# Patient Record
Sex: Female | Born: 1935 | Race: White | Hispanic: No | Smoking: Never smoker
Health system: Southern US, Community
[De-identification: ages and names within clinical notes are randomized; demographics above are authoritative.]

## PROBLEM LIST (undated history)

## (undated) DIAGNOSIS — M810 Age-related osteoporosis without current pathological fracture: Secondary | ICD-10-CM

## (undated) DIAGNOSIS — S4991XA Unspecified injury of right shoulder and upper arm, initial encounter: Secondary | ICD-10-CM

## (undated) DIAGNOSIS — M5430 Sciatica, unspecified side: Secondary | ICD-10-CM

## (undated) DIAGNOSIS — I2510 Atherosclerotic heart disease of native coronary artery without angina pectoris: Secondary | ICD-10-CM

## (undated) DIAGNOSIS — I10 Essential (primary) hypertension: Secondary | ICD-10-CM

## (undated) HISTORY — DX: Unspecified injury of right shoulder and upper arm, initial encounter: S49.91XA

## (undated) HISTORY — DX: Age-related osteoporosis without current pathological fracture: M81.0

## (undated) HISTORY — PX: CATARACT EXTRACTION: SUR2

## (undated) HISTORY — PX: OTHER SURGICAL HISTORY: SHX169

## (undated) HISTORY — DX: Essential (primary) hypertension: I10

## (undated) HISTORY — PX: STENT PLACEMENT VASCULAR (ARMC HX): HXRAD1737

## (undated) HISTORY — DX: Atherosclerotic heart disease of native coronary artery without angina pectoris: I25.10

## (undated) HISTORY — DX: Sciatica, unspecified side: M54.30

---

## 2016-02-17 ENCOUNTER — Other Ambulatory Visit: Payer: Self-pay | Admitting: Urology

## 2016-02-17 DIAGNOSIS — R3129 Other microscopic hematuria: Secondary | ICD-10-CM

## 2016-02-24 ENCOUNTER — Ambulatory Visit
Admission: RE | Admit: 2016-02-24 | Discharge: 2016-02-24 | Disposition: A | Discharge status: Home / Self Care | Payer: Medicare Other | Source: Ambulatory Visit | Attending: Urology | Admitting: Urology

## 2016-02-24 DIAGNOSIS — R3129 Other microscopic hematuria: Secondary | ICD-10-CM | POA: Insufficient documentation

## 2017-10-26 IMAGING — US US RENAL
1 series · 14 of 25 positions shown · non-contrast
Comparison: None in PACs

CLINICAL DATA: Microscopic hematuria

EXAM:
RENAL / URINARY TRACT ULTRASOUND COMPLETE

[Series 1: us renal · 0.22mm/px · 14 of 63 slices shown]
[im 1/63]
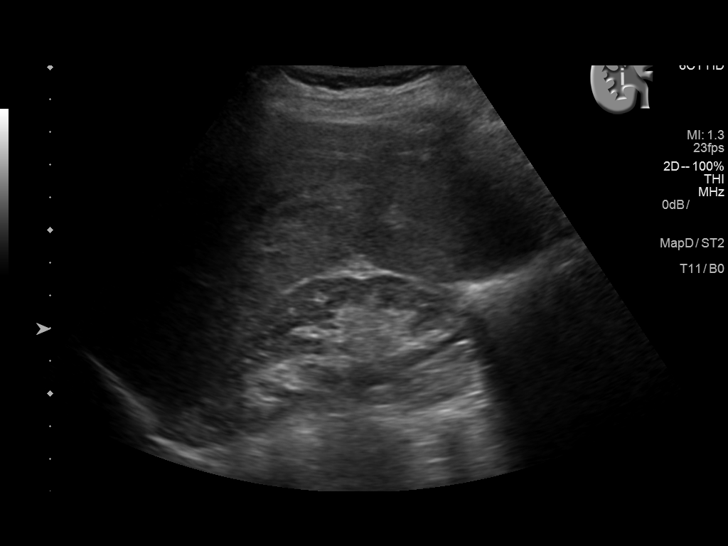
[im 6/63]
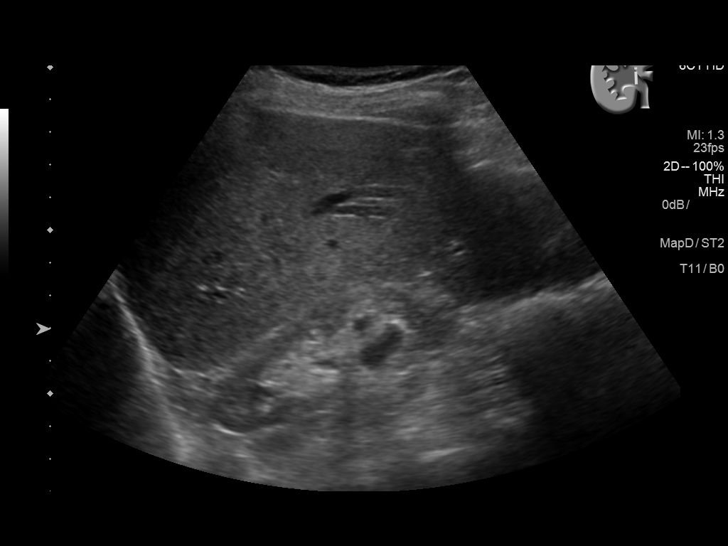
[im 11/63]
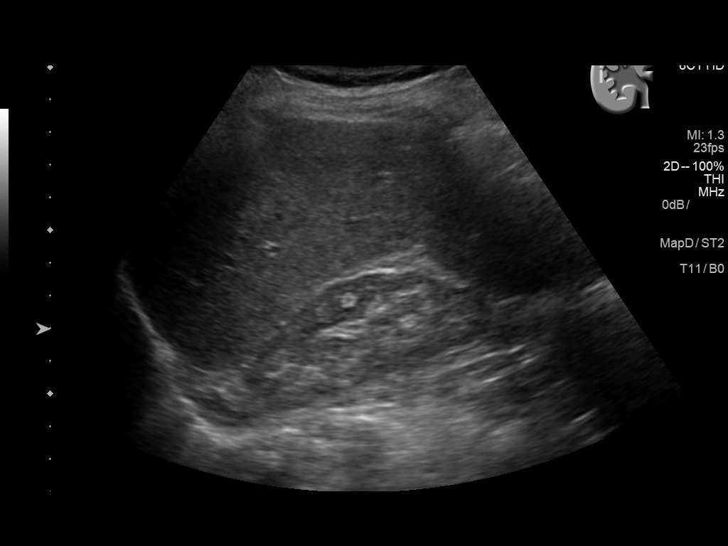
[im 16/63]
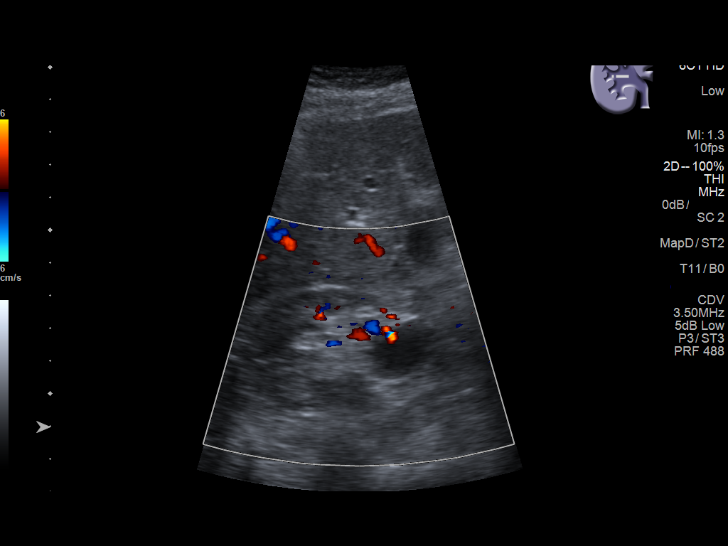
[im 21/63]
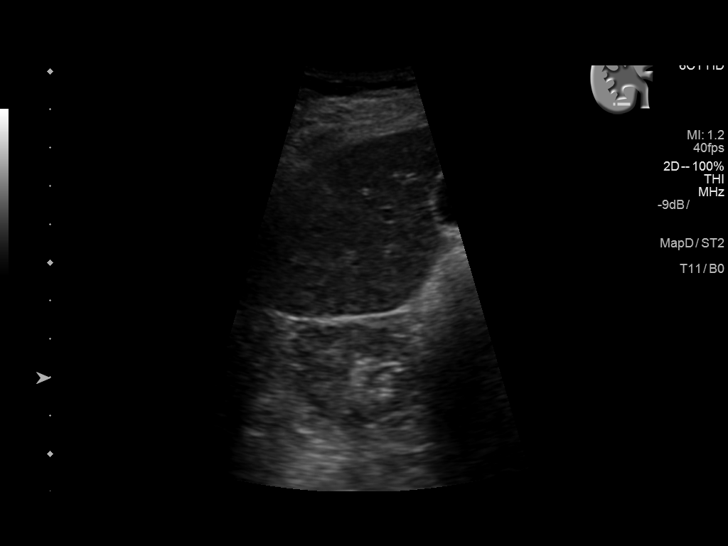
[im 24/63]
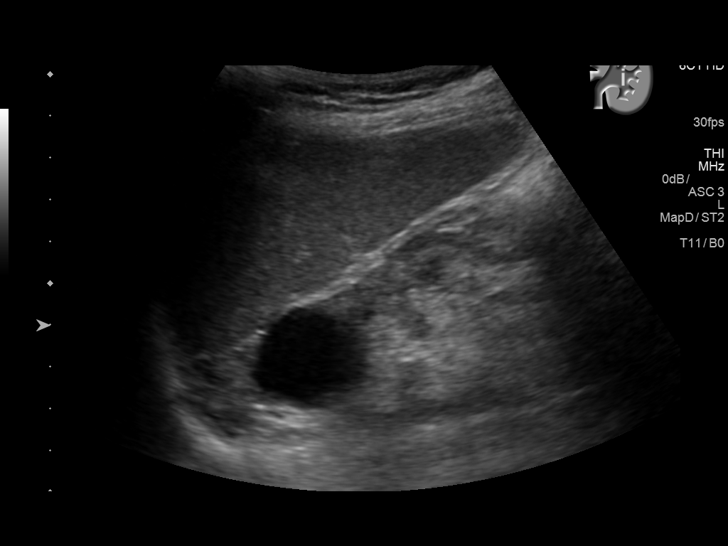
[im 29/63]
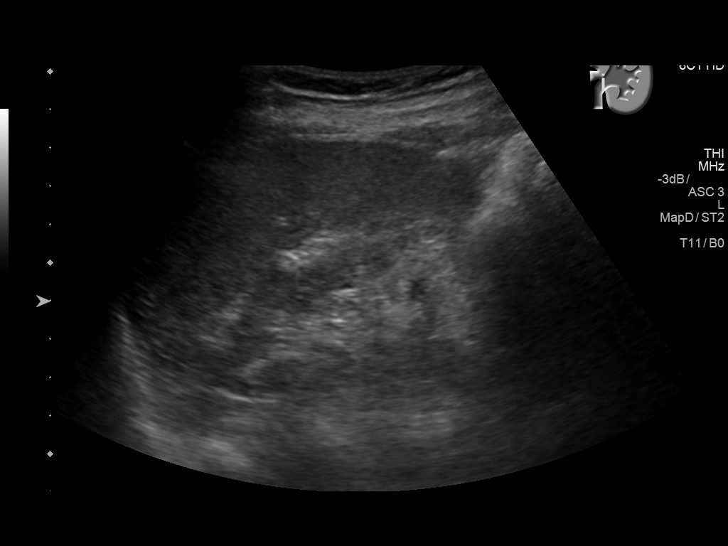
[im 34/63]
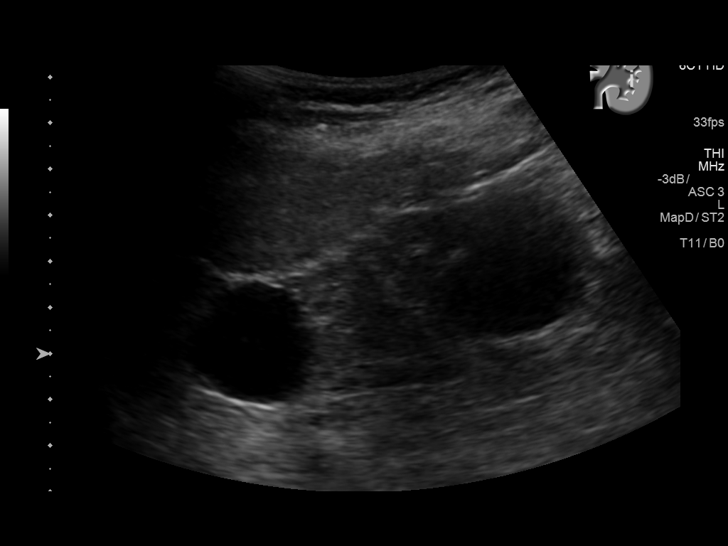
[im 39/63]
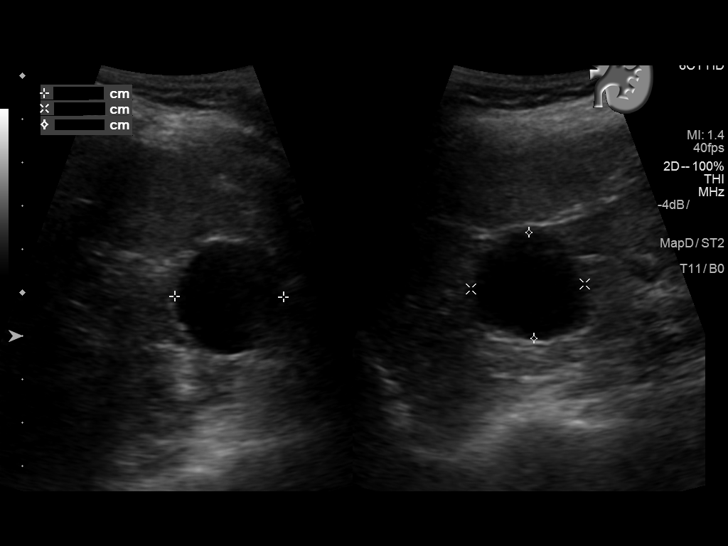
[im 42/63]
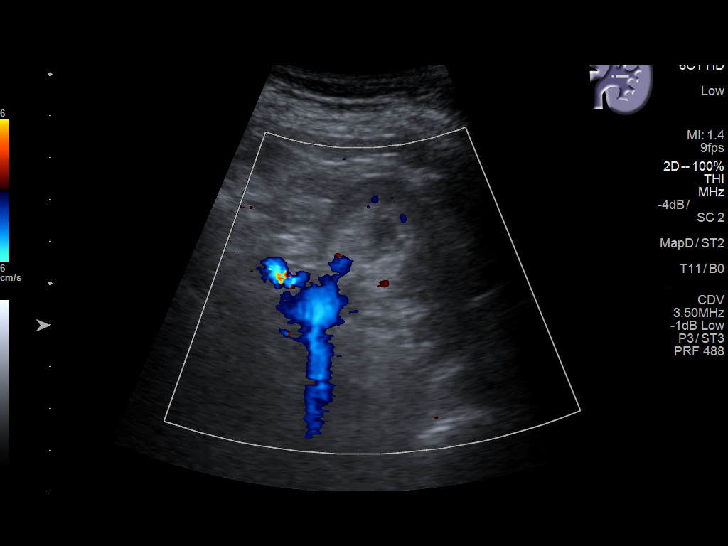
[im 47/63]
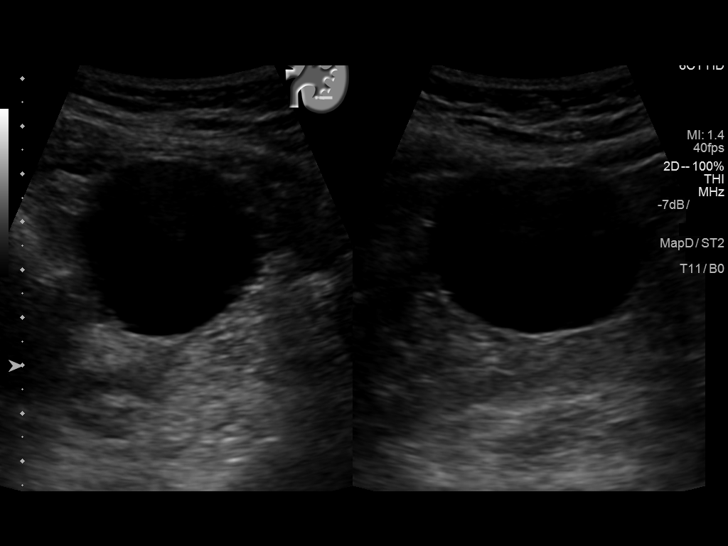
[im 52/63]
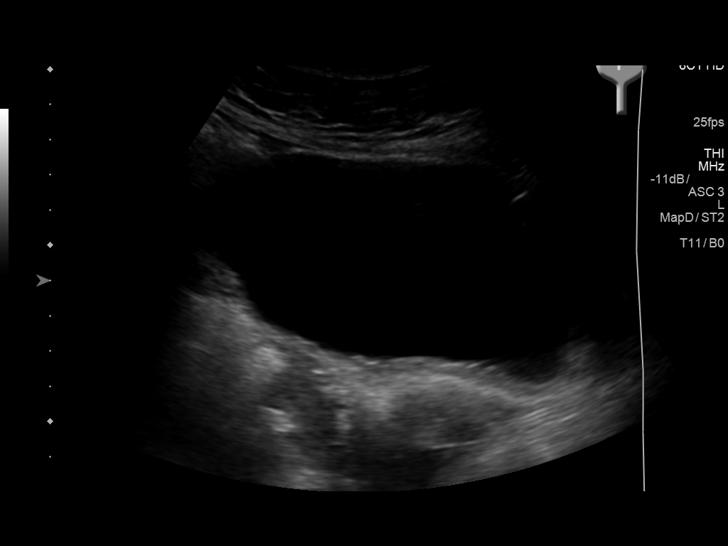
[im 57/63]
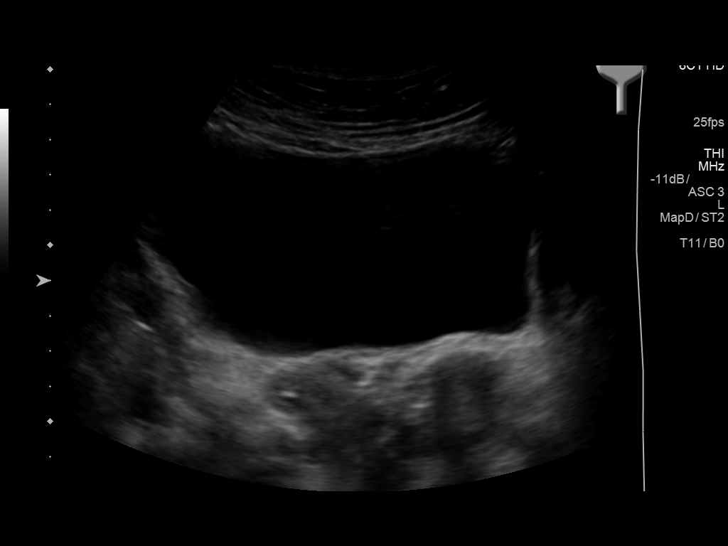
[im 63/63]
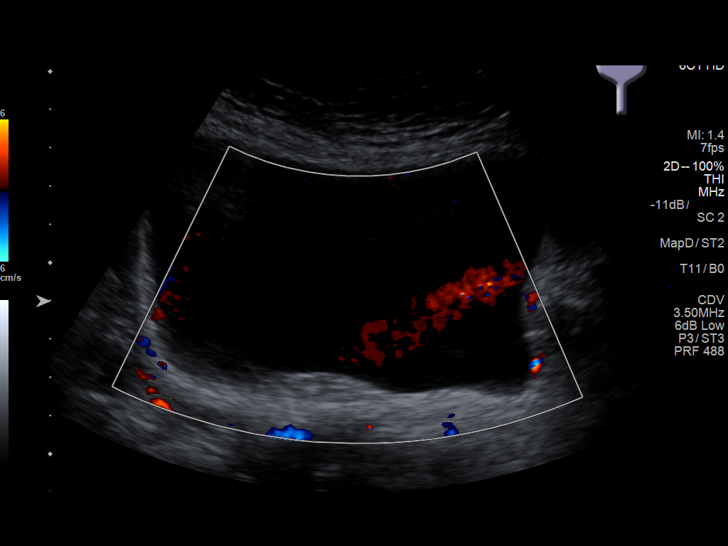

[14 of 25 positions shown; findings below may reference images not displayed]

FINDINGS: Right Kidney:

Length: 8.6 cm. There is a midpole parapelvic cyst measuring 1.9 cm
in greatest dimension. There is no hydronephrosis. The renal
cortical echotexture remains slightly lower than that of the
adjacent liver.

Left Kidney:

Length: 10.4 cm. The renal cortical echotexture on the left is
similar to that on the right. In the upper pole there is a 2.6 cm
diameter simple appearing cyst. In the lower pole there is a 4.6 x
3.4 x 3.9 cm simple appearing cyst. There is no hydronephrosis.

Bladder:

Appears normal for degree of bladder distention. Bilateral ureteral
jets are observed.
IMPRESSION: Mild right renal atrophy. Mildly increased renal cortical
echotexture (still lower than that of the adjacent liver) may
reflect medical renal disease. There is no hydronephrosis. Simple
appearing cysts in both kidneys.

Normal appearance of the urinary bladder.

## 2018-12-11 ENCOUNTER — Telehealth: Payer: Self-pay | Admitting: Primary Care

## 2018-12-11 NOTE — Telephone Encounter (Signed)
T/c to POA to offer technology based visit either telemedicine or telephone assessment. Left number and asked POA to return call.

## 2019-01-17 ENCOUNTER — Telehealth: Payer: Self-pay | Admitting: Primary Care

## 2019-01-17 NOTE — Telephone Encounter (Signed)
Left message on machine to t/c for palliative care appt. Patient has not called back from message in early April. Will f/u and if no answer or request to follow, will d/c.

## 2019-02-05 ENCOUNTER — Telehealth: Payer: Self-pay | Admitting: Primary Care

## 2019-02-05 NOTE — Telephone Encounter (Signed)
T/c to make palliative medicine follow up appointment. No answer, message left. This is the third attempt to schedule and message left that if no call is returned, I will d/c from palliative consultation for now.

## 2019-02-07 ENCOUNTER — Telehealth: Payer: Self-pay | Admitting: Primary Care

## 2019-02-07 NOTE — Telephone Encounter (Signed)
Received call back from daughter, who would like to schedule a palliative medicine follow up. She will discuss with the patient and call back to make an appointment.

## 2019-02-19 ENCOUNTER — Telehealth: Payer: Self-pay | Admitting: Primary Care

## 2019-02-19 NOTE — Telephone Encounter (Signed)
T/c to patient to schedule palliative visit. Spoke with her daughter Blanch Media. Patient is in ED with high blood pressure. She is at [facility]. Daughter will call or email to schedule

## 2019-02-26 ENCOUNTER — Other Ambulatory Visit: Payer: Self-pay

## 2019-02-26 ENCOUNTER — Other Ambulatory Visit: Payer: Medicare Other | Admitting: Primary Care

## 2019-02-26 DIAGNOSIS — Z515 Encounter for palliative care: Secondary | ICD-10-CM

## 2019-02-26 NOTE — Progress Notes (Signed)
[facility] Consult Note Telephone: [phone]  Fax: [phone]   PATIENT NAME: Emily Chaney DOB: [birth year] MRN: [id number]  PRIMARY CARE PROVIDER:   Barbaraann Chaney, [place]  REFERRING PROVIDER:  Barbaraann Chaney, [address] [place],  [place] [phone]  RESPONSIBLE PARTY:   Extended Emergency Contact Information Primary Emergency Contact: Emily Chaney [place] of [place] Phone: [phone] Relation: Daughter  Palliative Care was asked to follow this patient by consultation request of Emily Boys, MD. This is a follow up visit. I met with patient and daughter face to face in home.   ASSESSMENT AND RECOMMENDATIONS:   1. Goals of Care: Maximize quality of life and symptom management.  2. Symptom Management:   Pain: H/o broken right shoulder, now healed. She has been (I) with adls and many iadls since last summer. Last palliative care visit was a year ago.   Vertigo: Recent dizziness x 2 weeks, makes her feel "out of it", not dizzy but fuzzy. BP has been high, Recently in [facility] ED and was kept for a while due to elevation. Nnephrology increased carevedilol  From 12. 78m  Bid to  Increased to 25 mg in am and 12.5 mg pm unchanged. Will see hematology and nephrology for f/u. States feeling much better now with this change in medications.  UTI: recently was treated with abx and found to have elevate WBC. Upcoming appt, new to hematology, wbc has been up and this will be an initial work up.  Uro-gynocology: Had a pessary but can't use it now due to extreme prolapse. [facility] She continue to have discomfort from prolapse and will f/u again with gyn in order to sume her previous assessment.   3. Family /Caregiver/Community Supports:  Caregiver concerned about going away for respite, or other  for time frame if needed. She  is seeking some sort of companion service. [place] [facility] [place] email and phone given. [place]  4. Cognitive / Functional decline: No decline noted, patient is slightly hard of hearing but answers all questions and is an excellent historian. 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  5. Advanced Care Directive: Not on file, will f/u with this on next discussion. Currently actively seeking treatment for various medical issues.  6. Follow up Palliative Care Visit: Palliative care will continue to follow for goals of care clarification and symptom management. Return 3 months or prn.  I spent 60 minutes providing this consultation,  from 1330 to 1430. More than 50% of the time in this consultation was spent coordinating communication.   HISTORY OF PRESENT ILLNESS:  Emily Herfordis a 85y.o. year old female with multiple medical problems including HTN, CAD, MI, HLD, uterovaginal prolapse, cervical fusion, s/p R shoulder fracture, 02/2018. Palliative Care was asked to help address goals of care.   CODE STATUS:  FULL  PPS: 70% HOSPICE ELIGIBILITY/DIAGNOSIS: TBD  PAST MEDICAL HISTORY: No past medical history on file.  SOCIAL HX:  Social History   Tobacco Use  . Smoking status: Not on file  Substance Use Topics  . Alcohol use: Not on file    ALLERGIES: Not on File   PERTINENT MEDICATIONS:  No outpatient encounter medications on file as of 83/22/2020.   No  facility-administered encounter medications on file as of 83/22/2020.     PHYSICAL EXAM/ROS:      148/64  And 124/62  Recent readings, had been up to 230/103  Current and past weights: 114 lb General: NAD, frail appearing, thin Cardiovascular: no chest pain reported, no edema,  Pulmonary: no cough, no increased SOB Abdomen: appetite good, nausea improved, endorses occ. Constipation but also loose stools so she titrates the miralax.  is continent of bowel GU: denies dysuria, continent of urine , prolapsed uterus. MSK:  S/p shoulder fx last year, uses cane in home at hs, does not use going out.Uses rollator to go out side and walks on street.  Skin: no rashes or wounds reported Neurological: Weakness,endorses dizziness but denies changes In LOC, denies vision changes, no recent falls.  Sleep is variable, sleeps about 4 hours then  Cant' get back to sleep. Has tried melatonin without success.   Emily Skeeters DNP, AGPCNP-BC

## 2019-02-27 ENCOUNTER — Other Ambulatory Visit: Payer: Self-pay

## 2019-02-27 NOTE — Progress Notes (Signed)
[facility]  [address], [address] [place], [place] [address] Phone: [phone]  Fax: [phone]   Clinic Day:  02/28/2019  Referring physician: Barbaraann Boys, MD  Chief Complaint: Emily Chaney is a 85 y.o. female with leukocytosis who is referred in consultation by Dr. Janene Harvey for assessment and management.   HPI:  The patient became aware of leukocytosis in 02/05/2019. Review of prior CBCs reveals a mild persistent leukocytosis since 07/21/2018.  WBC has ranged between 13,500 - 22,100.  She has had lymphocytosis.  She has had mild anemia (hemoglobin 11.6-11.8) and normal platelet count.  CBC on 02/02/2019 revealed a hematocrit of 34.6, hemoglobin 11.7, MCV 90, platelets 238,000, WBC 16,100 with an ANC of 6800.  Differential included 42% segs, 48% lymphs, and 9% monocytes.  Labs on 01/26/2019 revealed a BUN 39, creatinine 1.1, calcium 10.4, albumen 4.1, TSH 3.82.  PTH intact was 44 (normal) on 09/26/2018.  She was in the ED for HTN on 02/19/2019.  She saw Estevan Oaks, NP for symptom management for pain, vertigo, UTI on 02/26/2019.  Symptomatically, she feels good. The patient is accompanied by her daughter Emily Chaney via telephone today. She reports being hearing impaired.   She notes she has slight anemia. She eats meat and iron rich foods 4 times a week. She has a well balanced diet. She denies any pica.  She notes 2 recent UTI's, uterine prolapse, and sciatica pain. She denies any other infections. She denies diarrhea, nausea, fever, sweats, shortnenss of breath, and abdominal pain. She denies any adenopathy. She denies taking any oral steroids or steroid injections in the joints.   Her brother has CLL.   Past Medical History:  Diagnosis Date  . Coronary artery disease   . Hypertension   . Osteoporosis   . Sciatica   . Shoulder injury, right, initial encounter     Past Surgical History:  Procedure Laterality Date  . CATARACT EXTRACTION Bilateral     x 2   . cervical spinal fusion    . STENT PLACEMENT VASCULAR ([facility] HX)      Family History  Problem Relation Age of Onset  . Cancer Mother     Social History:  reports that she has never smoked. She has never used smokeless tobacco. She reports that she does not drink alcohol or use drugs. She denies exposure to radiation and toxins. She is originally from [place], and lived in [place] for 22 years. She currently lives in [place]. The patient is accompanied by her daughter Emily Chaney via telephone today.   Allergies:  Allergies  Allergen Reactions  . Amlodipine Rash  . Alendronate Other (See Comments)    Swallowing difficulty Swallowing difficulty   . Hydralazine Other (See Comments)    Other reaction(s): Dizziness lightheaded lightheaded   . Sulfa Antibiotics Other (See Comments)    Other Reaction: OTHER REACTION: NUMBNESS     Current Medications: Current Outpatient Medications  Medication Sig Dispense Refill  . acetaminophen (TYLENOL) 325 MG tablet Take 325 mg by mouth every 6 (six) hours as needed.     Marland Kitchen aspirin EC 81 MG tablet Take 81 mg by mouth daily.     Marland Kitchen atorvastatin (LIPITOR) 40 MG tablet Take 40 mg by mouth daily at 6 PM.     . Calcium 200 MG TABS Take 400 mg by mouth 2 (two) times a day.    . carvedilol (COREG) 12.5 MG tablet Take by mouth 2 (two) times daily with a meal. 2 Tablets  AM, 1 Tablet QHS    . chlorthalidone (HYGROTON) 25 MG tablet Take 25 mg by mouth daily.     Marland Kitchen lisinopril (ZESTRIL) 20 MG tablet Take 20 mg by mouth daily.     . Multiple Vitamins-Minerals (MULTIVITAMIN ADULT PO) Take 1 tablet by mouth daily.     Marland Kitchen triamcinolone ointment (KENALOG) 0.1 % Apply topically.     No current facility-administered medications for this visit.     Review of Systems  Constitutional: Negative.  Negative for chills, fever, malaise/fatigue and weight loss.       Feels "good".  HENT: Positive for hearing loss. Negative for congestion, ear discharge, sinus pain  and sore throat.   Eyes: Negative.  Negative for blurred vision, double vision and photophobia.  Respiratory: Negative.  Negative for cough, hemoptysis and shortness of breath.   Cardiovascular: Negative.  Negative for chest pain, palpitations and leg swelling.  Gastrointestinal: Negative.  Negative for abdominal pain, constipation, diarrhea, heartburn, nausea and vomiting.  Genitourinary: Negative for dysuria, frequency, hematuria and urgency.       UTI x 2; uterine prolapse.  Musculoskeletal: Negative.  Negative for back pain, joint pain and myalgias.  Skin: Negative.  Negative for rash.  Neurological: Negative for dizziness, tingling, speech change, focal weakness, weakness and headaches.       Sciatica.  Endo/Heme/Allergies: Does not bruise/bleed easily.  Psychiatric/Behavioral: Negative.  Negative for depression and memory loss. The patient is not nervous/anxious and does not have insomnia.   All other systems reviewed and are negative.  Performance status (ECOG):  1  Blood pressure (!) 198/81, pulse 67, temperature 98.5 F (36.9 C), temperature source Tympanic, resp. rate 18, height 5' (1.524 m), weight 113 lb 1.5 oz (51.3 kg).  Physical Exam  Constitutional: She is oriented to person, place, and time. She appears well-developed and well-nourished. No distress.  HENT:  Head: Normocephalic and atraumatic.  Mouth/Throat: Oropharynx is clear and moist. No oropharyngeal exudate.  Short gray hair.  Wearing a face mask.  Eyes: Pupils are equal, round, and reactive to light. Conjunctivae and EOM are normal. No scleral icterus.  Blue eyes.  Neck: Normal range of motion. Neck supple. No JVD present.  Cardiovascular: Normal rate and regular rhythm. Exam reveals no gallop and no friction rub.  No murmur heard. Pulmonary/Chest: Effort normal and breath sounds normal. No respiratory distress. She has no wheezes. She has no rales.  Abdominal: Soft. Bowel sounds are normal. She exhibits no  distension and no mass. There is no abdominal tenderness. There is no rebound and no guarding.  Musculoskeletal:        General: No edema.  Lymphadenopathy:    She has no cervical adenopathy.  Neurological: She is alert and oriented to person, place, and time. She has normal reflexes.  Skin: Skin is warm and dry. No rash noted. She is not diaphoretic. No erythema. No pallor.  Psychiatric: She has a normal mood and affect. Her behavior is normal. Judgment and thought content normal.  Nursing note and vitals reviewed.   No visits with results within 3 Day(s) from this visit.  Latest known visit with results is:  No results found for any previous visit.    Assessment:  Emily Chaney is a 85 y.o. female mild persistent leukocytosis since 07/21/2018.  WBC has ranged between 13,500 - 22,100.  She has had lymphocytosis.  She has had mild anemia (hemoglobin 11.6-11.8) and normal platelet count.  She denies recurrent infections.  She denies oral or  injected steroids.  She does not smoke.  Symptomatically, she feels good.  She denies any fevers, sweats or weight loss.  Exam reveals no adenopathy or hepatosplenomegaly.  Plan: 1.   Labs today:  CBC with diff, ferritin, iron studies, B12, folate, retic, sed rate, flow cytometry. 2.   Mild leukocytosis  Patient has had a history of mild leukocytosis since 07/2018.  Differential has been predominantly lymphocytes.  Discuss work-up.    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  Briefly discuss CLL and indications for treatment. 3.   Mild anemia  Patient notes a history of mild anemia.  Diet appears good.  She denies any bleeding.  Check ferritin, iron stores, B12, folate, retic.  Etiology may be secondary to CLL (see above). 4.   RTC in 1 week for MD assessment (Doximity with daughter's phone), review of work-up and discussion regarding direction of therapy.  I discussed the assessment and treatment plan with the patient.  The patient was provided an  opportunity to ask questions and all were answered.  The patient agreed with the plan and demonstrated an understanding of the instructions.  The patient was advised to call back if the symptoms worsen or if the condition fails to improve as anticipated.   Melissa C. Mike Gip, MD, PhD    02/28/2019, 2:22 PM  I, Selena Batten, am acting as scribe for [employer]. Mike Gip, MD, PhD.  I, Melissa C. Mike Gip, MD, have reviewed the above documentation for accuracy and completeness, and I agree with the above.

## 2019-02-28 ENCOUNTER — Inpatient Hospital Stay: Payer: Medicare Other

## 2019-02-28 ENCOUNTER — Encounter: Payer: Self-pay | Admitting: Hematology and Oncology

## 2019-02-28 ENCOUNTER — Inpatient Hospital Stay: Payer: Medicare Other | Attending: Hematology and Oncology | Admitting: Hematology and Oncology

## 2019-02-28 VITALS — BP 198/81 | HR 67 | Temp 98.5°F | Resp 18 | Ht 60.0 in | Wt 113.1 lb

## 2019-02-28 DIAGNOSIS — Z888 Allergy status to other drugs, medicaments and biological substances status: Secondary | ICD-10-CM

## 2019-02-28 DIAGNOSIS — D649 Anemia, unspecified: Secondary | ICD-10-CM | POA: Insufficient documentation

## 2019-02-28 DIAGNOSIS — Z79899 Other long term (current) drug therapy: Secondary | ICD-10-CM | POA: Insufficient documentation

## 2019-02-28 DIAGNOSIS — D72829 Elevated white blood cell count, unspecified: Secondary | ICD-10-CM | POA: Insufficient documentation

## 2019-02-28 DIAGNOSIS — Z882 Allergy status to sulfonamides status: Secondary | ICD-10-CM | POA: Diagnosis not present

## 2019-02-28 DIAGNOSIS — H9190 Unspecified hearing loss, unspecified ear: Secondary | ICD-10-CM | POA: Insufficient documentation

## 2019-02-28 DIAGNOSIS — N390 Urinary tract infection, site not specified: Secondary | ICD-10-CM

## 2019-02-28 DIAGNOSIS — N814 Uterovaginal prolapse, unspecified: Secondary | ICD-10-CM | POA: Diagnosis not present

## 2019-02-28 DIAGNOSIS — I10 Essential (primary) hypertension: Secondary | ICD-10-CM

## 2019-02-28 DIAGNOSIS — R42 Dizziness and giddiness: Secondary | ICD-10-CM | POA: Diagnosis not present

## 2019-02-28 DIAGNOSIS — I2510 Atherosclerotic heart disease of native coronary artery without angina pectoris: Secondary | ICD-10-CM | POA: Diagnosis not present

## 2019-02-28 DIAGNOSIS — M5430 Sciatica, unspecified side: Secondary | ICD-10-CM | POA: Diagnosis not present

## 2019-02-28 DIAGNOSIS — C9110 Chronic lymphocytic leukemia of B-cell type not having achieved remission: Secondary | ICD-10-CM | POA: Insufficient documentation

## 2019-02-28 DIAGNOSIS — Z809 Family history of malignant neoplasm, unspecified: Secondary | ICD-10-CM | POA: Insufficient documentation

## 2019-02-28 LAB — CBC WITH DIFFERENTIAL/PLATELET
Abs Immature Granulocytes: 0.05 10*3/uL (ref 0.00–0.07)
Basophils Absolute: 0 10*3/uL (ref 0.0–0.1)
Basophils Relative: 0 %
Eosinophils Absolute: 0.1 10*3/uL (ref 0.0–0.5)
Eosinophils Relative: 1 %
HCT: 36.1 % (ref 36.0–46.0)
Hemoglobin: 12.4 g/dL (ref 12.0–15.0)
Immature Granulocytes: 0 %
Lymphocytes Relative: 52 %
Lymphs Abs: 8.4 10*3/uL — ABNORMAL HIGH (ref 0.7–4.0)
MCH: 30.8 pg (ref 26.0–34.0)
MCHC: 34.3 g/dL (ref 30.0–36.0)
MCV: 89.8 fL (ref 80.0–100.0)
Monocytes Absolute: 1 10*3/uL (ref 0.1–1.0)
Monocytes Relative: 6 %
Neutro Abs: 6.7 10*3/uL (ref 1.7–7.7)
Neutrophils Relative %: 41 %
Platelets: 197 10*3/uL (ref 150–400)
RBC: 4.02 MIL/uL (ref 3.87–5.11)
RDW: 13.1 % (ref 11.5–15.5)
WBC: 16.3 10*3/uL — ABNORMAL HIGH (ref 4.0–10.5)
nRBC: 0 % (ref 0.0–0.2)

## 2019-02-28 LAB — RETICULOCYTES
Immature Retic Fract: 7.7 % (ref 2.3–15.9)
RBC.: 4.06 MIL/uL (ref 3.87–5.11)
Retic Count, Absolute: 46.7 10*3/uL (ref 19.0–186.0)
Retic Ct Pct: 1.2 % (ref 0.4–3.1)

## 2019-02-28 LAB — IRON AND TIBC
Iron: 66 ug/dL (ref 28–170)
Saturation Ratios: 18 % (ref 10.4–31.8)
TIBC: 373 ug/dL (ref 250–450)
UIBC: 307 ug/dL

## 2019-02-28 LAB — FERRITIN: Ferritin: 44 ng/mL (ref 11–307)

## 2019-02-28 LAB — VITAMIN B12: Vitamin B-12: 1302 pg/mL — ABNORMAL HIGH (ref 180–914)

## 2019-02-28 LAB — SEDIMENTATION RATE: Sed Rate: 15 mm/hr (ref 0–30)

## 2019-02-28 LAB — FOLATE: Folate: 63.1 ng/mL (ref >5.9)

## 2019-02-28 NOTE — Progress Notes (Signed)
Pt. Here as new patient referral from Dr. Janene Harvey regarding elevated WBC. During visit, patient had BP reading of 198/81. Patient reports she has been having a lot of trouble with her BP and she has recently had medication change with Nephrologist (approx 10 days ago) Reports she was in ER with HTN approx 1 week ago. Patient states BP was 139/55 at home prior to coming in.

## 2019-03-02 LAB — COMP PANEL: LEUKEMIA/LYMPHOMA: Immunophenotypic Profile: 36

## 2019-03-05 ENCOUNTER — Other Ambulatory Visit: Payer: Self-pay

## 2019-03-05 ENCOUNTER — Other Ambulatory Visit: Payer: Self-pay | Admitting: Hematology and Oncology

## 2019-03-05 DIAGNOSIS — D72829 Elevated white blood cell count, unspecified: Secondary | ICD-10-CM

## 2019-03-06 NOTE — Progress Notes (Signed)
[facility]  [address] [place], [place] [address] Phone: [phone]  Fax: [phone]   Telemedicine Office Visit:  03/08/2019  Referring physician: Barbaraann Boys, MD  I connected with Emily Chaney on 03/08/2019 at 9:12 AM by videoconferencing and verified that I was speaking with the correct person using 2 identifiers.  The patient was at home.  I discussed the limitations, risk, security and privacy concerns of performing an evaluation and management service by videoconferencing and the availability of in person appointments.  I also discussed with the patient that there may be a patient responsible charge related to this service.  The patient expressed understanding and agreed to proceed.   Chief Complaint: Emily Chaney is a 85 y.o. female with leukocytosis who is seen for review of work-up and discussion regarding direction of therapy.   HPI: The patient was last seen in the hematology clinic on 02/28/2019 for initial consultation.  Symptomatically, she felt good.  She denied any fevers, sweats or weight loss.  Exam revealed no adenopathy or hepatosplenomegaly.   She underwent a work-up: CBC revealed a hematocrit of 36.1, hemoglobin 12.4, MCV 89.8, platelets 197,000, white count 16,300 with an ANC of 6700.  Absolute lymphocyte count was 8400.  Reticulocyte count was 1.2%.  B12 was 1302.  Folate was 63.1.  Ferritin was 44.  Iron saturation was 18% with a TIBC of 373.  Sed rate was 15.    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  Findings were consistent with a CD5+ B-cell lymphoproliferative disorder but phenotype was not completely typical for chronic lymphocytic leukemia (CLL)/small lymphocytic lymphoma (SLL) or mantle cell lymphoma (MCL).  Phenotype favored atypical CLL.  FISH studies were sent.  During the interim, she has felt good. She reports that she is starting to feel full quicker  when eating. She denies any weight loss. She reports that she stopped taking her calcium.    Past Medical History:  Diagnosis Date   Coronary artery disease    Hypertension    Osteoporosis    Sciatica    Shoulder injury, right, initial encounter     Past Surgical History:  Procedure Laterality Date   CATARACT EXTRACTION Bilateral    x 2    cervical spinal fusion     STENT PLACEMENT VASCULAR ([facility] HX)      Family History  Problem Relation Age of Onset   Cancer Mother     Social History:  reports that she has never smoked. She has never used smokeless tobacco. She reports that she does not drink alcohol or use drugs. She denies exposure to radiation and toxins. She is originally from [place], and lived in [place] for 22 years. She currently lives in [place].The patient is accompanied by her daughter today.  Participants in the patient's visit and their role in the encounter included the patient, her daughter, and Waymon Budge, RN today.  The intake visit was provided by Waymon Budge, RN.  Allergies:  Allergies  Allergen Reactions   Amlodipine Rash   Alendronate Other (See Comments)    Swallowing difficulty Swallowing difficulty    Hydralazine Other (See Comments)    Other reaction(s): Dizziness lightheaded lightheaded    Sulfa Antibiotics Other (See Comments)    Other Reaction: OTHER REACTION: NUMBNESS     Current Medications: Current Outpatient Medications  Medication Sig Dispense Refill   acetaminophen (TYLENOL) 325 MG tablet Take 325 mg by mouth every 6 (  six) hours as needed.      aspirin EC 81 MG tablet Take 81 mg by mouth daily.      atorvastatin (LIPITOR) 40 MG tablet Take 40 mg by mouth daily at 6 PM.      carvedilol (COREG) 12.5 MG tablet Take by mouth 2 (two) times daily with a meal. 2 Tablets AM, 1 Tablet QHS     chlorthalidone (HYGROTON) 25 MG tablet Take 12.5 mg by mouth 2 (two) times a day.      lisinopril (ZESTRIL) 20  MG tablet Take 20 mg by mouth 2 (two) times a day.      Multiple Vitamins-Minerals (MULTIVITAMIN ADULT PO) Take 1 tablet by mouth daily.      triamcinolone ointment (KENALOG) 0.1 % Apply 1 application topically as needed.      Calcium 200 MG TABS Take 400 mg by mouth 2 (two) times a day.     No current facility-administered medications for this visit.      Review of Systems  Constitutional: Negative.  Negative for chills, fever, malaise/fatigue and weight loss.       Doing well.  HENT: Positive for hearing loss. Negative for congestion, ear discharge, sinus pain and sore throat.   Eyes: Negative.  Negative for blurred vision, double vision and photophobia.  Respiratory: Negative.  Negative for cough, hemoptysis, shortness of breath and wheezing.   Cardiovascular: Negative.  Negative for chest pain, palpitations and leg swelling.  Gastrointestinal: Negative.  Negative for abdominal pain, constipation, diarrhea, heartburn, nausea and vomiting.  Genitourinary: Negative for dysuria, frequency, hematuria and urgency.       UTI x 2; uterine prolapse.  Musculoskeletal: Negative.  Negative for back pain, joint pain and myalgias.  Skin: Negative.  Negative for rash.  Neurological: Negative for dizziness, tingling, speech change, focal weakness, weakness and headaches.       Sciatica.  Endo/Heme/Allergies: Does not bruise/bleed easily.  Psychiatric/Behavioral: Negative.  Negative for depression and memory loss. The patient is not nervous/anxious and does not have insomnia.   All other systems reviewed and are negative.   Performance status (ECOG): 1  Vital Signs There were no vitals taken for this visit.  Physical Exam  Constitutional: She is oriented to person, place, and time. She appears well-developed and well-nourished. No distress.  HENT:  Head: Normocephalic and atraumatic.  Short gray hair.  Eyes: Pupils are equal, round, and reactive to light. Conjunctivae and EOM are normal.  No scleral icterus.  Blue eyes.  Neurological: She is alert and oriented to person, place, and time. She has normal reflexes.  Skin: She is not diaphoretic. No pallor.  Psychiatric: She has a normal mood and affect. Her behavior is normal. Judgment and thought content normal.  Nursing note and vitals reviewed.   No visits with results within 3 Day(s) from this visit.  Latest known visit with results is:  Office Visit on 02/28/2019  Component Date Value Ref Range Status   Folate 02/28/2019 63.1  >5.9 ng/mL Final   Comment: RESULT CONFIRMED BY MANUAL DILUTION/TFK Performed at [facility], [address] [place], [place] [address]    Retic Ct Pct 02/28/2019 1.2  0.4 - 3.1 % Final   RBC. 02/28/2019 4.06  3.87 - 5.11 MIL/uL Final   Retic Count, Absolute 02/28/2019 46.7  19.0 - 186.0 K/uL Final   Immature Retic Fract 02/28/2019 7.7  2.3 - 15.9 % Final   Performed at [facility], [address] [place], [place]  [address]   Vitamin B-12 02/28/2019 1,302* 180 - 914 pg/mL Final   Comment: (NOTE) This assay is not validated for testing neonatal or myeloproliferative syndrome specimens for Vitamin B12 levels. Performed at [facility], [place] [address], [place], [place] [address]    Sed Rate 02/28/2019 15  0 - 30 mm/hr Final   Performed at [facility], [address] [place], [place] [address]   Iron 02/28/2019 66  28 - 170 ug/dL Final   TIBC 02/28/2019 373  250 - 450 ug/dL Final   Saturation Ratios 02/28/2019 18  10.4 - 31.8 % Final   UIBC 02/28/2019 307  ug/dL Final   Performed at [facility], [address] [place], [place] [address]   Ferritin 02/28/2019 44  11 - 307 ng/mL Final   Performed at [facility], [address] [place], [place] [address]   PATH Sharlette Dense XXX-IMP 02/28/2019 Comment   Final   Comment: (NOTE) CD5+, moderate CD23+, FMC7+, CD38+ (87%) B cell lymphoproliferative disorder detected. (See  comment.)    ANNOTATION COMMENT IMP 02/28/2019 Comment   Corrected   Comment: (NOTE) Findings are consistent with a CD5+ B-cell lymphoproliferative disorder but the phenotype detected is not completely typical for chronic lymphocytic leukemia (CLL)/small lymphocytic lymphoma (SLL) or mantle cell lymphoma (MCL), the two most common CD5+ lymphoproliferative disorders. The phenotype favors an atypical CLL over mantle cell lymphoma. However, clinical, morphologic and cytogenetic correlation, which includes FISH to detect the t(11;14) translocation present in most MCLs, is necessary for further subclassification, if clinically indicated. Expression of CD38 on >30% of the clonal B-cells is reported to be an unfavorable prognostic factor in CLL.    CLINICAL INFO 02/28/2019 Comment   Corrected   Comment: (NOTE) A recent CBC was not available for review at the time this report was prepared.    Specimen Type 02/28/2019 Comment   Final   Peripheral blood   ASSESSMENT OF LEUKOCYTES 02/28/2019 Comment   Final   Comment: (NOTE) A CD5+, moderate CD23+, FMC7+, CD38+ (87%) clonal B cell population is detected with very dim lambda light chain restriction, representing >99% of the B cells and 36% of the leukocytes. The phenotype can be seen in CLL/ SLL, but is not completely typical due to the brighter than expected CD20 expression, the slightly dimmer than expected CD23 expression, and the presence of FMC-7 expression. There is no loss of, or aberrant expression of, the pan T cell antigens to suggest a neoplastic T cell process. CD4:CD8 ratio 1.2 No circulating blasts are detected. There is no immunophenotypic evidence of abnormal myeloid maturation. Analysis of the leukocyte population shows: granulocytes 49%, monocytes 4%, lymphocytes 47%, blasts <0.1%, B cells 36%, T cells 9%, NK cells 2%.    % Viable Cells 02/28/2019 Comment   Corrected   93%   Immunophenotypic Profile  02/28/2019 36% of total cells (Phenotype below)   Corrected   Comment: Comment Abnormal cell population: present    ANALYSIS AND GATING STRATEGY 02/28/2019 Comment   Final   8 color analysis with CD45/SSC   IMMUNOPHENOTYPING STUDY 02/28/2019 Comment   Final   Comment: (NOTE) CD2       (-)            CD3       (-) CD4       (-)            CD5       (+) CD7       (-)  CD8       (-) CD10      (-)            CD11b     (-) CD11c     (+)            CD13      (-) CD14      (-)            CD15      (-) CD16      (-)            CD19      (+) CD20      (+)            CD22      (+) CD23      (+) Moderate   CD33      (-) CD34      (-)            CD38      (+) CD45      (+)            CD56      (-) CD57      (-)            CD103     (-) CD117     (-)            FMC-7     (+) HLA-DR    (+)            KAPPA     (-) LAMBDA    (+) Dim        CD64      (-)    PATHOLOGIST NAME 02/28/2019 Comment   Final   Lovett Sox, M.D.   COMMENT: 02/28/2019 Comment   Corrected   Comment: (NOTE) Each antibody in this assay was utilized to assess for potential abnormalities of studied cell populations or to characterize identified abnormalities. This test was developed and its performance characteristics determined by [facility].  It has not been cleared or approved by the U.S. Food and Drug Administration. The FDA has determined that such clearance or approval is not necessary. This test is used for clinical purposes.  It should not be regarded as investigational or for research. Performed At: -[facility] [address] [place], [place] [id number] Katina Degree MDPhD [id number] Performed At: [facility] [address] [place], [place] [id number] Katina Degree MDPhD [id number]    WBC 02/28/2019 16.3* 4.0 - 10.5 K/uL Final   RBC 02/28/2019 4.02  3.87 - 5.11 MIL/uL Final   Hemoglobin 02/28/2019 12.4  12.0 - 15.0 g/dL Final   HCT 02/28/2019 36.1  36.0 - 46.0 % Final   MCV  02/28/2019 89.8  80.0 - 100.0 fL Final   MCH 02/28/2019 30.8  26.0 - 34.0 pg Final   MCHC 02/28/2019 34.3  30.0 - 36.0 g/dL Final   RDW 02/28/2019 13.1  11.5 - 15.5 % Final   Platelets 02/28/2019 197  150 - 400 K/uL Final   nRBC 02/28/2019 0.0  0.0 - 0.2 % Final   Neutrophils Relative % 02/28/2019 41  % Final   Neutro Abs 02/28/2019 6.7  1.7 - 7.7 K/uL Final   Lymphocytes Relative 02/28/2019 52  % Final   Lymphs Abs 02/28/2019 8.4* 0.7 - 4.0 K/uL Final   Monocytes Relative 02/28/2019 6  % Final   Monocytes Absolute 02/28/2019 1.0  0.1 - 1.0 K/uL Final  Eosinophils Relative 02/28/2019 1  % Final   Eosinophils Absolute 02/28/2019 0.1  0.0 - 0.5 K/uL Final   Basophils Relative 02/28/2019 0  % Final   Basophils Absolute 02/28/2019 0.0  0.0 - 0.1 K/uL Final   Immature Granulocytes 02/28/2019 0  % Final   Abs Immature Granulocytes 02/28/2019 0.05  0.00 - 0.07 K/uL Final   Performed at [facility], [address], [place], [place] [address]    Assessment:  Emily Chaney is a 85 y.o. 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  WBC has ranged between 13,500 - 22,100.  She has had lymphocytosis.  She has had mild anemia (hemoglobin 11.6-11.8) and normal platelet count.  Work-up on 02/28/2019 revealeda hematocrit of 36.1, hemoglobin 12.4, MCV 89.8, platelets 197,000, white count 16,300 with an ANC of 6700.  Absolute lymphocyte count was 8400.  Reticulocyte count was 1.2%.  Normal studies included:  B12, folate, ferritin (44), iron saturation, and sed rate.    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  Findings were consistent with a CD5+ B-cell lymphoproliferative disorder but phenotype was not completely typical for chronic lymphocytic leukemia (CLL)/small lymphocytic lymphoma (SLL) or mantle cell lymphoma (MCL).  Phenotype favored atypical CLL.  FISH studies are pending.  She  denies recurrent infections.  She denies oral or injected steroids.  She does not smoke.  Symptomatically, she is doing well.  She denies any B symptoms.    Plan: 1    Review work-up from 02/28/2019. 2.   B-cell lymphoproliferative disorder             Review work-up including flow cytometry.  Anticipate final diagnoses will be atypical CLL.  Await FISH studies.  Re-review indications for treatment.  Discuss plans for ongoing surveillance. 3.   Mild anemia, resolved.             She has a history of mild anemia.    Work-up reveals a normal B12, folate, ferritin and iron studies.    Etiology likely secondary to CLL.  Current counts include a hemoglobin of 12.4 and MCV 89.8, both normal.  Continue to monitor. 4.   RTC in 3 months for MD assessment and labs (CBC with diff, CMP, LDH, uric acid).  Addendum:  FISH revealed 85% of nuclei positive for trisomy 12.  Negative studies include CCND1/IGH, ATM, 13 q and P53.  Studies are consistent with CLL  I discussed the assessment and treatment plan with the patient.  The patient was provided an opportunity to ask questions and all were answered.  The patient agreed with the plan and demonstrated an understanding of the instructions.  The patient was advised to call back or seek an in person evaluation if the symptoms worsen or if the condition fails to improve as anticipated.  I provided 15 minutes (9:12 AM - 9:28 AM) of face-to-face video visit time during this this encounter and > 50% was spent counseling as documented under my assessment and plan.  I provided these services from the [facility] office.   Nolon Stalls, MD, PhD  03/08/2019, 9:12 AM  I, Selena Batten, am acting as scribe for [employer]. Mike Gip, MD, PhD.  I, Hadasah Brugger C. Mike Gip, MD, have reviewed the above documentation for accuracy and completeness, and I agree with the above.

## 2019-03-08 ENCOUNTER — Inpatient Hospital Stay: Payer: Medicare Other | Attending: Hematology and Oncology | Admitting: Hematology and Oncology

## 2019-03-08 ENCOUNTER — Encounter: Payer: Self-pay | Admitting: Hematology and Oncology

## 2019-03-08 DIAGNOSIS — D479 Neoplasm of uncertain behavior of lymphoid, hematopoietic and related tissue, unspecified: Secondary | ICD-10-CM | POA: Diagnosis not present

## 2019-03-08 NOTE — Progress Notes (Signed)
Confirmed Name, DOB, and Address. Denies any concerns.  

## 2019-03-13 LAB — FISH HES LEUKEMIA, 4Q12 REA

## 2019-03-15 ENCOUNTER — Telehealth: Payer: Self-pay

## 2019-03-15 NOTE — Telephone Encounter (Signed)
Left a message to infrom the patient her labs were not back at this time and after they are released we will reach out to her with the results. I have also informed the patient if she have any question please feel free to contact the office at [phone].

## 2019-03-20 ENCOUNTER — Telehealth: Payer: Self-pay

## 2019-03-20 NOTE — Telephone Encounter (Signed)
Incoming call from patient's daughter, Blanch Media. Daughter states patient requested her to call in to obtain results due to her having a hard time hearing over the phone.  Explained to daughter results and that she was negative for MCL. Daughter reports understanding and denies any further questions or concerns.

## 2019-04-06 ENCOUNTER — Telehealth: Payer: Self-pay | Admitting: *Deleted

## 2019-04-06 NOTE — Telephone Encounter (Signed)
Contacted patient to inquire of Fatigue. Patient states she gets extremely fatigued approx. 2-3 days every week. Patient is asking if there are any additional supplements, etc. That she can take to help with fatigue. Informed patient, per Dr. Mike Gip, she should reach out to her PCP and inform them of symptom to see if they would like to do additional labs or make any recommendations. Patient verbalizes understanding and denies any further questions or concerns.

## 2019-04-06 NOTE — Telephone Encounter (Signed)
  Please call patient.  I don't believe her fatigue is caused by the minor changes she has on her counts.  Recommend follow-up with PCP.

## 2019-04-06 NOTE — Telephone Encounter (Signed)
Patient called reporting that she is having a lot of fatigue and is asking what she can do or take for it

## 2019-06-14 ENCOUNTER — Telehealth: Payer: Self-pay

## 2019-06-14 NOTE — Telephone Encounter (Signed)
Contacted patient. Pt. states that she had extreme exhaustion a couple of weeks ago but is much better now. Denies any S&S. Does not need to be seen sooner than 10/21 appt. per patient. Advised patient to call in should she think she needs to be seen sooner. Dr. Mike Gip made aware.

## 2019-06-19 ENCOUNTER — Telehealth: Payer: Self-pay | Admitting: Primary Care

## 2019-06-19 NOTE — Telephone Encounter (Signed)
T/c to patient to f/u on home palliative visit. Patient had asked for a call back in the fall. Message left.

## 2019-06-25 NOTE — Progress Notes (Signed)
Confirmed Name, DOB, and Address. Reports having a red, raised, itching rash approx 2 weeks ago. Patient states she was placed on medication by PCP and reports rash is much better. Denies any other concerns.

## 2019-06-26 NOTE — Progress Notes (Signed)
[facility]  [address] [place], [place] [address] Phone: [phone]  Fax: [phone]   Office Visit:  06/27/2019  Referring physician: Barbaraann Boys, MD   Chief Complaint: Emily Chaney is a 85 y.o. female with chronic lymphocytic leukemia (CLL) who is seen for 3 month assessment.  HPI: The patient was last seen in the hematology clinic on 03/08/2019. At that time, initial work-up was reviewed.  She was felt likely to have CLL.  FISH studies were pending.  Symptomatically, she was doing well.  She denied any B symptoms.   Hematocrit was 36.1, hemoglobin 12.4, platelets 197,000, white count 16,300 (ANC 6700; ALC 8400).  During the interim, she has felt "ok".  She notes about 2 months ago she felt significantly fatigued.  Symptoms resolved.  She described being tired mid-day.  She denied any fevers or sweats.  She had some nausea for 2 - 3 weeks in the morning and emesis x1.  Symptoms again resolved.  She had a pruritic rash 2 weeks ago.    Past Medical History:  Diagnosis Date  . Coronary artery disease   . Hypertension   . Osteoporosis   . Sciatica   . Shoulder injury, right, initial encounter     Past Surgical History:  Procedure Laterality Date  . CATARACT EXTRACTION Bilateral    x 2   . cervical spinal fusion    . STENT PLACEMENT VASCULAR ([facility] HX)      Family History  Problem Relation Age of Onset  . Cancer Mother     Social History:  reports that she has never smoked. She has never used smokeless tobacco. She reports that she does not drink alcohol or use drugs. She denies exposure to radiation and toxins. She is originally from [place], and lived in [place] for 22 years. She currently lives in [place]. The patient is accompanied by her daughter, Blanch Media, on the Ipad today.   Allergies:  Allergies  Allergen Reactions  . Amlodipine Rash  . Alendronate Other (See Comments)    Swallowing difficulty Swallowing difficulty    . Hydralazine Other (See Comments)    Other reaction(s): Dizziness lightheaded lightheaded   . Sulfa Antibiotics Other (See Comments)    Other Reaction: OTHER REACTION: NUMBNESS     Current Medications: Current Outpatient Medications  Medication Sig Dispense Refill  . acetaminophen (TYLENOL) 325 MG tablet Take 325 mg by mouth every 6 (six) hours as needed.     Marland Kitchen aspirin EC 81 MG tablet Take 81 mg by mouth daily.     Marland Kitchen atorvastatin (LIPITOR) 40 MG tablet Take 40 mg by mouth daily at 6 PM.     . busPIRone (BUSPAR) 5 MG tablet Take 5 mg by mouth daily.     . Calcium 200 MG TABS Take 400 mg by mouth 2 (two) times a day.    . carvedilol (COREG) 12.5 MG tablet Take by mouth 2 (two) times daily with a meal. 2 Tablets AM, 1 Tablet QHS    . chlorthalidone (HYGROTON) 25 MG tablet Take 12.5 mg by mouth 2 (two) times a day.     . lisinopril (ZESTRIL) 20 MG tablet Take 20 mg by mouth 2 (two) times a day.     . Multiple Vitamins-Minerals (MULTIVITAMIN ADULT PO) Take 1 tablet by mouth daily.      No current facility-administered medications for this visit.     Review of Systems  Constitutional: Positive for weight loss (2 pounds). Negative  for chills, fever and malaise/fatigue.       Feels "ok".  HENT: Positive for hearing loss. Negative for congestion, ear discharge, ear pain, sinus pain and sore throat.   Eyes: Negative.  Negative for blurred vision, double vision and photophobia.  Respiratory: Negative.  Negative for cough, hemoptysis, shortness of breath and wheezing.   Cardiovascular: Negative.  Negative for chest pain, palpitations and leg swelling.  Gastrointestinal: Negative.  Negative for abdominal pain, constipation, diarrhea, heartburn, nausea and vomiting.  Genitourinary: Negative for dysuria, frequency, hematuria and urgency.  Musculoskeletal: Negative.  Negative for back pain, falls, joint pain and myalgias.  Skin: Negative for rash (resolved).  Neurological: Negative for  dizziness, tingling, sensory change, speech change, focal weakness, weakness and headaches.       Sciatica, chronic.  Endo/Heme/Allergies: Negative.  Does not bruise/bleed easily.  Psychiatric/Behavioral: Negative.  Negative for memory loss. The patient is not nervous/anxious and does not have insomnia.   All other systems reviewed and are negative.   Performance status (ECOG):  1  Vital Signs Blood pressure (!) 206/71, pulse 65, temperature (!) 96.5 F (35.8 C), temperature source Tympanic, resp. rate 18, height 5' (1.524 m), weight 111 lb 3.6 oz (50.4 kg), SpO2 100 %.  Physical Exam  Constitutional: She is oriented to person, place, and time. She appears well-developed and well-nourished. No distress.  Thin elderly woman sitting comfortably in the exam room in no acute distress.  She has a cane by her side.  HENT:  Head: Normocephalic and atraumatic.  Mouth/Throat: Oropharynx is clear and moist. No oropharyngeal exudate.  Short gray hair.  Mask.  Eyes: Pupils are equal, round, and reactive to light. Conjunctivae and EOM are normal. No scleral icterus.  Hazel/blue eyes.  Neck: Normal range of motion. Neck supple. No JVD present.  Cardiovascular: Normal rate and normal heart sounds. Exam reveals no gallop.  No murmur heard. Pulmonary/Chest: Effort normal and breath sounds normal. No respiratory distress. She has no wheezes. She has no rales.  Abdominal: Soft. Bowel sounds are normal. She exhibits no distension. There is no abdominal tenderness. There is no rebound and no guarding.  Musculoskeletal: Normal range of motion.        General: No tenderness or edema.  Lymphadenopathy:       Head (right side): No preauricular, no posterior auricular and no occipital adenopathy present.       Head (left side): No preauricular, no posterior auricular and no occipital adenopathy present.    She has no cervical adenopathy.    She has no axillary adenopathy.       Right: No inguinal and no  supraclavicular adenopathy present.       Left: No inguinal and no supraclavicular adenopathy present.  Neurological: She is alert and oriented to person, place, and time. She has normal reflexes.  Skin: Skin is warm and dry. No rash noted. She is not diaphoretic. No erythema. No pallor.  Psychiatric: She has a normal mood and affect. Her behavior is normal. Judgment and thought content normal.  Nursing note and vitals reviewed.   Appointment on 06/27/2019  Component Date Value Ref Range Status  . WBC 06/27/2019 17.5* 4.0 - 10.5 K/uL Final  . RBC 06/27/2019 3.94  3.87 - 5.11 MIL/uL Final  . Hemoglobin 06/27/2019 12.2  12.0 - 15.0 g/dL Final  . HCT 06/27/2019 35.9* 36.0 - 46.0 % Final  . MCV 06/27/2019 91.1  80.0 - 100.0 fL Final  . MCH 06/27/2019 31.0  26.0 -  34.0 pg Final  . MCHC 06/27/2019 34.0  30.0 - 36.0 g/dL Final  . RDW 06/27/2019 13.0  11.5 - 15.5 % Final  . Platelets 06/27/2019 201  150 - 400 K/uL Final  . nRBC 06/27/2019 0.0  0.0 - 0.2 % Final   Performed at [facility], [address] [place], [place] [address]  . Neutrophils Relative % 06/27/2019 PENDING  % Incomplete  . Neutro Abs 06/27/2019 PENDING  1.7 - 7.7 K/uL Incomplete  . Band Neutrophils 06/27/2019 PENDING  % Incomplete  . Lymphocytes Relative 06/27/2019 PENDING  % Incomplete  . Lymphs Abs 06/27/2019 PENDING  0.7 - 4.0 K/uL Incomplete  . Monocytes Relative 06/27/2019 PENDING  % Incomplete  . Monocytes Absolute 06/27/2019 PENDING  0.1 - 1.0 K/uL Incomplete  . Eosinophils Relative 06/27/2019 PENDING  % Incomplete  . Eosinophils Absolute 06/27/2019 PENDING  0.0 - 0.5 K/uL Incomplete  . Basophils Relative 06/27/2019 PENDING  % Incomplete  . Basophils Absolute 06/27/2019 PENDING  0.0 - 0.1 K/uL Incomplete  . WBC Morphology 06/27/2019 PENDING   Incomplete  . RBC Morphology 06/27/2019 PENDING   Incomplete  . Smear Review 06/27/2019 PENDING   Incomplete  . Other 06/27/2019 PENDING  % Incomplete  . nRBC  06/27/2019 PENDING  0 /100 WBC Incomplete  . Metamyelocytes Relative 06/27/2019 PENDING  % Incomplete  . Myelocytes 06/27/2019 PENDING  % Incomplete  . Promyelocytes Relative 06/27/2019 PENDING  % Incomplete  . Blasts 06/27/2019 PENDING  % Incomplete  . Immature Granulocytes 06/27/2019 PENDING  % Incomplete  . Abs Immature Granulocytes 06/27/2019 PENDING  0.00 - 0.07 K/uL Incomplete    Assessment:  Emily Chaney is a 85 y.o. female with chronic lymphocytic leukemia (CLL) since 07/21/2018.  WBC has ranged between 13,500 - 22,100.  She has had lymphocytosis.  She has had mild anemia (hemoglobin 11.6-11.8) and normal platelet count.  Work-up on 02/28/2019 revealeda hematocrit of 36.1, hemoglobin 12.4, MCV 89.8, platelets 197,000, white count 16,300 with an ANC of 6700.  Absolute lymphocyte count was 8400.  Reticulocyte count was 1.2%.  Normal studies included:  B12, folate, ferritin (44), iron saturation, and sed rate.    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  Findings were consistent with a CD5+ B-cell lymphoproliferative disorder but phenotype was not completely typical for chronic lymphocytic leukemia (CLL)/small lymphocytic lymphoma (SLL) or mantle cell lymphoma (MCL).  Phenotype favored atypical CLL.  FISH revealed 85% of nuclei positive for trisomy 12.  Negative studies include CCND1/IGH, ATM, 13 q and P53.  Studies are consistent with CLL.  She denies recurrent infections.  She denies oral or injected steroids.  She does not smoke.  Symptomatically, she is doing well.  She denies any fevers, sweats or weight loss.  Exam reveals no adenopathy or hepatosplenomegaly.  Plan: 1    Labs today:  CBC with diff, CMP, LDH, uric acid. 2.   Chronic lymphocytic leukenmia             WBC 17,500 (ANC 6800; ALC 9500).  No B symptoms.  Exam reveals no adenopathy or hepatosplenomegaly.    Continue ongoing surveillance.  Anticipate follow-up  every 6 months after next visit if stable. 3.   Mild anemia, resolved  Hematocrit 35.9.  Hemoglobin 12.2.  MCV 91.1.  B12, folate, ferritin and iron studies were normal on 02/28/2019.  Continue to monitor. 4.   RTC in 4 months for MD assessment and labs (CBC with diff,  CMP, LDH, uric acid).   Nolon Stalls, MD, PhD  06/27/2019, 1:25 PM

## 2019-06-27 ENCOUNTER — Encounter: Payer: Self-pay | Admitting: Hematology and Oncology

## 2019-06-27 ENCOUNTER — Inpatient Hospital Stay: Payer: Medicare Other

## 2019-06-27 ENCOUNTER — Other Ambulatory Visit: Payer: Self-pay

## 2019-06-27 ENCOUNTER — Inpatient Hospital Stay: Payer: Medicare Other | Attending: Hematology and Oncology | Admitting: Hematology and Oncology

## 2019-06-27 ENCOUNTER — Telehealth: Payer: Self-pay

## 2019-06-27 VITALS — BP 206/71 | HR 65 | Temp 96.5°F | Resp 18 | Ht 60.0 in | Wt 111.2 lb

## 2019-06-27 DIAGNOSIS — Z79899 Other long term (current) drug therapy: Secondary | ICD-10-CM | POA: Diagnosis not present

## 2019-06-27 DIAGNOSIS — Z7982 Long term (current) use of aspirin: Secondary | ICD-10-CM | POA: Insufficient documentation

## 2019-06-27 DIAGNOSIS — C9110 Chronic lymphocytic leukemia of B-cell type not having achieved remission: Secondary | ICD-10-CM | POA: Insufficient documentation

## 2019-06-27 DIAGNOSIS — E785 Hyperlipidemia, unspecified: Secondary | ICD-10-CM | POA: Diagnosis not present

## 2019-06-27 DIAGNOSIS — I2510 Atherosclerotic heart disease of native coronary artery without angina pectoris: Secondary | ICD-10-CM | POA: Diagnosis not present

## 2019-06-27 DIAGNOSIS — I10 Essential (primary) hypertension: Secondary | ICD-10-CM | POA: Diagnosis not present

## 2019-06-27 DIAGNOSIS — D479 Neoplasm of uncertain behavior of lymphoid, hematopoietic and related tissue, unspecified: Secondary | ICD-10-CM

## 2019-06-27 LAB — COMPREHENSIVE METABOLIC PANEL
ALT: 28 U/L (ref 0–44)
AST: 39 U/L (ref 15–41)
Albumin: 4.3 g/dL (ref 3.5–5.0)
Alkaline Phosphatase: 54 U/L (ref 38–126)
Anion gap: 11 (ref 5–15)
BUN: 36 mg/dL — ABNORMAL HIGH (ref 8–23)
CO2: 26 mmol/L (ref 22–32)
Calcium: 10.2 mg/dL (ref 8.9–10.3)
Chloride: 91 mmol/L — ABNORMAL LOW (ref 98–111)
Creatinine, Ser: 1.04 mg/dL — ABNORMAL HIGH (ref 0.44–1.00)
GFR calc Af Amer: 54 mL/min — ABNORMAL LOW (ref >60)
GFR calc non Af Amer: 47 mL/min — ABNORMAL LOW (ref >60)
Glucose, Bld: 145 mg/dL — ABNORMAL HIGH (ref 70–99)
Potassium: 4.3 mmol/L (ref 3.5–5.1)
Sodium: 128 mmol/L — ABNORMAL LOW (ref 135–145)
Total Bilirubin: 0.5 mg/dL (ref 0.3–1.2)
Total Protein: 7.7 g/dL (ref 6.5–8.1)

## 2019-06-27 LAB — CBC WITH DIFFERENTIAL/PLATELET
Abs Immature Granulocytes: 0.05 10*3/uL (ref 0.00–0.07)
Basophils Absolute: 0.1 10*3/uL (ref 0.0–0.1)
Basophils Relative: 0 %
Eosinophils Absolute: 0.1 10*3/uL (ref 0.0–0.5)
Eosinophils Relative: 1 %
HCT: 35.9 % — ABNORMAL LOW (ref 36.0–46.0)
Hemoglobin: 12.2 g/dL (ref 12.0–15.0)
Immature Granulocytes: 0 %
Lymphocytes Relative: 55 %
Lymphs Abs: 9.5 10*3/uL — ABNORMAL HIGH (ref 0.7–4.0)
MCH: 31 pg (ref 26.0–34.0)
MCHC: 34 g/dL (ref 30.0–36.0)
MCV: 91.1 fL (ref 80.0–100.0)
Monocytes Absolute: 0.9 10*3/uL (ref 0.1–1.0)
Monocytes Relative: 5 %
Neutro Abs: 6.8 10*3/uL (ref 1.7–7.7)
Neutrophils Relative %: 39 %
Platelets: 201 10*3/uL (ref 150–400)
RBC: 3.94 MIL/uL (ref 3.87–5.11)
RDW: 13 % (ref 11.5–15.5)
WBC: 17.5 10*3/uL — ABNORMAL HIGH (ref 4.0–10.5)
nRBC: 0 % (ref 0.0–0.2)

## 2019-06-27 LAB — LACTATE DEHYDROGENASE: LDH: 160 U/L (ref 98–192)

## 2019-06-27 LAB — URIC ACID: Uric Acid, Serum: 6.6 mg/dL (ref 2.5–7.1)

## 2019-06-27 NOTE — Telephone Encounter (Signed)
Rechecked patient's BP per Dr. Kem Parkinson request. Resulted at 206/83. Informed Dr. Mike Gip. Patient states she has white coat syndrome and reports "this happens all the time, as soon as I go home and check it, it will be much lower". Dr. Mike Gip advised patient recheck BP at home, if it continues to be elevated she needs to reach out to PCP. Patient also made aware that if she develops any chest pain, SOB, etc. To go to ER. Patient verbalizes understanding and denies any further questions or concerns.

## 2019-08-30 ENCOUNTER — Telehealth: Payer: Self-pay | Admitting: Primary Care

## 2019-08-30 NOTE — Telephone Encounter (Signed)
Called patient's daughter Blanch Media to schedule a Palliative f/u visit, no answer - left message with reason for call along with my contact information

## 2019-09-14 ENCOUNTER — Telehealth: Payer: Self-pay | Admitting: Primary Care

## 2019-09-14 NOTE — Telephone Encounter (Signed)
Called patient's daughter Blanch Media as well as patient to schedule a Palliative f/u visit with the NP, no answer - left message at both numbers with reason for call along with my contact information.

## 2019-09-21 ENCOUNTER — Telehealth: Payer: Self-pay | Admitting: Primary Care

## 2019-09-21 NOTE — Telephone Encounter (Signed)
Called patient's daughter Blanch Media to schedule a Palliative f/u visit - no answer, left message with reason for call along with my contact information.  I also called patient's home number with no answer, left message with my contact number.

## 2019-09-27 ENCOUNTER — Telehealth: Payer: Self-pay | Admitting: Primary Care

## 2019-09-27 NOTE — Telephone Encounter (Signed)
Rec'd call back from patient and we have scheduled a Telephone Palliative f/u visit for 11/05/19 @ 11 AM

## 2019-10-29 ENCOUNTER — Inpatient Hospital Stay: Payer: Medicare Other | Admitting: Hematology and Oncology

## 2019-10-29 ENCOUNTER — Inpatient Hospital Stay: Payer: Medicare Other | Attending: Hematology and Oncology

## 2019-10-29 DIAGNOSIS — Z79899 Other long term (current) drug therapy: Secondary | ICD-10-CM | POA: Insufficient documentation

## 2019-10-29 DIAGNOSIS — Z7982 Long term (current) use of aspirin: Secondary | ICD-10-CM | POA: Insufficient documentation

## 2019-10-29 DIAGNOSIS — C9110 Chronic lymphocytic leukemia of B-cell type not having achieved remission: Secondary | ICD-10-CM | POA: Insufficient documentation

## 2019-10-29 DIAGNOSIS — I2510 Atherosclerotic heart disease of native coronary artery without angina pectoris: Secondary | ICD-10-CM | POA: Insufficient documentation

## 2019-10-29 DIAGNOSIS — I10 Essential (primary) hypertension: Secondary | ICD-10-CM | POA: Insufficient documentation

## 2019-10-30 NOTE — Progress Notes (Signed)
[facility]  [address], [address] [place], [place] [address] Phone: [phone]  Fax: [phone]   Clinic Day:  10/31/2019  Referring physician: Barbaraann Boys, MD  Chief Complaint: Emily Chaney is a 86 y.o. female with chronic lymphocytic leukemia (CLL) who is seen for a 4 month assessment.  HPI: The patient was last seen in the hematology clinic on 06/27/2019. At that time, she was doing well.  She denied any fevers, sweats or weight loss.  Exam revealed no adenopathy or hepatosplenomegaly. Hematocrit 35.9, hemoglobin 12.2, platelets 201,000, WBC 17,500 (ANC 6,800; ALC 9,500). Sodium was 128. Creatinine was 1.04. LDH was 160. Uric acid was 6.6. We discussed ongoing surveillance .   Labs from [facility] on 10/23/2019 included hematocrit 34.4, hemoglobin 11.2, platelets 198,000, WBC 20,000 (ANC 5,300; ALC 13,100). Uric acid was 6.9. LDH 164. Sodium 129. Calcium 9.4.  During the interim, she has felt ok. Her BP was 209/84 then 187/75 today. At home BP was 171/62 and she notes it has been elevated for a few days. Her PCP will be contacted. She denies any B symptoms. She denies any adenopathy or bone and joint pain. She notes feeling fatigued occasionally but is feeling good today.   She had her last COVID-19 vaccine was on 10/24/2019. She has no side effects.    Past Medical History:  Diagnosis Date  . Coronary artery disease   . Hypertension   . Osteoporosis   . Sciatica   . Shoulder injury, right, initial encounter     Past Surgical History:  Procedure Laterality Date  . CATARACT EXTRACTION Bilateral    x 2   . cervical spinal fusion    . STENT PLACEMENT VASCULAR ([facility] HX)      Family History  Problem Relation Age of Onset  . Cancer Mother     Social History:  reports that she has never smoked. She has never used smokeless tobacco. She reports that she does not drink alcohol or use drugs.  She denies exposure to radiation and toxins. She is  originally from [place], and lived in [place] for 22 years. She[place]ives in [place].  The patient is accompanied by her daughter via phone today.  Allergies:  Allergies  Allergen Reactions  . Amlodipine Rash  . Alendronate Other (See Comments)    Swallowing difficulty Swallowing difficulty   . Hydralazine Other (See Comments)    Other reaction(s): Dizziness lightheaded lightheaded   . Sulfa Antibiotics Other (See Comments)    Other Reaction: OTHER REACTION: NUMBNESS     Current Medications: Current Outpatient Medications  Medication Sig Dispense Refill  . acetaminophen (TYLENOL) 325 MG tablet Take 325 mg by mouth every 6 (six) hours as needed.     Marland Kitchen aspirin EC 81 MG tablet Take 81 mg by mouth daily.     Marland Kitchen atorvastatin (LIPITOR) 40 MG tablet Take 40 mg by mouth daily at 6 PM.     . busPIRone (BUSPAR) 5 MG tablet Take 5 mg by mouth daily.     . Calcium 200 MG TABS Take 400 mg by mouth 2 (two) times a day.    . lisinopril (ZESTRIL) 20 MG tablet Take 20 mg by mouth 2 (two) times a day.     . Multiple Vitamins-Minerals (MULTIVITAMIN ADULT PO) Take 1 tablet by mouth daily.     . carvedilol (COREG) 12.5 MG tablet Take by mouth 2 (two) times daily with a meal. 2 Tablets AM, 1 Tablet QHS    .  chlorthalidone (HYGROTON) 25 MG tablet Take 12.5 mg by mouth 2 (two) times a day.      No current facility-administered medications for this visit.    Review of Systems  Constitutional: Negative.  Negative for chills, fever, malaise/fatigue (occasional) and weight loss (stable).       Doing ok.  HENT: Positive for hearing loss (hearing aids). Negative for congestion, ear discharge, ear pain, sinus pain and sore throat.   Eyes: Negative.  Negative for blurred vision, double vision and photophobia.  Respiratory: Negative.  Negative for cough, hemoptysis, shortness of breath and wheezing.   Cardiovascular: Negative.  Negative for chest pain, palpitations and leg swelling.    Gastrointestinal: Negative.  Negative for abdominal pain, constipation, diarrhea, heartburn, nausea and vomiting.  Genitourinary: Negative.  Negative for dysuria, frequency, hematuria and urgency.  Musculoskeletal: Negative.  Negative for back pain, falls, joint pain and myalgias.  Skin: Negative.  Negative for itching and rash.  Neurological: Negative for dizziness, tingling, sensory change, speech change, focal weakness, weakness and headaches.       Sciatica, chronic.  Endo/Heme/Allergies: Negative.  Does not bruise/bleed easily.  Psychiatric/Behavioral: Negative.  Negative for depression and memory loss. The patient is not nervous/anxious and does not have insomnia.   All other systems reviewed and are negative.  Performance status (ECOG): 1  Vitals Blood pressure (!) 187/75, pulse 73, temperature (!) 96.1 F (35.6 C), temperature source Tympanic, resp. rate 18, weight 112 lb 7 oz (51 kg), SpO2 100 %.   Physical Exam  Constitutional: She is oriented to person, place, and time. She appears well-developed and well-nourished. No distress.  Thin elderly woman sitting comfortably in the exam room in no acute distress.  She has a cane by her side.  HENT:  Head: Normocephalic and atraumatic.  Mouth/Throat: Oropharynx is clear and moist. No oropharyngeal exudate.  Short gray hair.  Mask.  Eyes: Pupils are equal, round, and reactive to light. Conjunctivae and EOM are normal. No scleral icterus.  Blue eyes.  Neck: No JVD present.  Cardiovascular: Normal rate and normal heart sounds. Exam reveals no gallop.  No murmur heard. Pulmonary/Chest: Effort normal and breath sounds normal. No respiratory distress. She has no wheezes. She has no rales.  Abdominal: Soft. Bowel sounds are normal. She exhibits no distension. There is no abdominal tenderness. There is no rebound and no guarding.  Musculoskeletal:        General: No tenderness or edema. Normal range of motion.     Cervical back: Normal  range of motion and neck supple.  Lymphadenopathy:       Head (right side): No preauricular, no posterior auricular and no occipital adenopathy present.       Head (left side): No preauricular, no posterior auricular and no occipital adenopathy present.    She has no cervical adenopathy.    She has no axillary adenopathy.       Right: No inguinal and no supraclavicular adenopathy present.       Left: No inguinal and no supraclavicular adenopathy present.  Neurological: She is alert and oriented to person, place, and time. She has normal reflexes.  Skin: Skin is warm and dry. No rash noted. She is not diaphoretic. No erythema. No pallor.  Psychiatric: She has a normal mood and affect. Her behavior is normal. Judgment and thought content normal.  Nursing note and vitals reviewed.   No visits with results within 3 Day(s) from this visit.  Latest known visit with results is:  Appointment on 06/27/2019  Component Date Value Ref Range Status  . LDH 06/27/2019 160  98 - 192 U/L Final   Performed at [facility], [address], [place], [place] [address]  . Uric Acid, Serum 06/27/2019 6.6  2.5 - 7.1 mg/dL Final   Performed at [facility], [address] [place], [place] [address]  . Sodium 06/27/2019 128* 135 - 145 mmol/L Final  . Potassium 06/27/2019 4.3  3.5 - 5.1 mmol/L Final  . Chloride 06/27/2019 91* 98 - 111 mmol/L Final  . CO2 06/27/2019 26  22 - 32 mmol/L Final  . Glucose, Bld 06/27/2019 145* 70 - 99 mg/dL Final  . BUN 06/27/2019 36* 8 - 23 mg/dL Final  . Creatinine, Ser 06/27/2019 1.04* 0.44 - 1.00 mg/dL Final  . Calcium 06/27/2019 10.2  8.9 - 10.3 mg/dL Final  . Total Protein 06/27/2019 7.7  6.5 - 8.1 g/dL Final  . Albumin 06/27/2019 4.3  3.5 - 5.0 g/dL Final  . AST 06/27/2019 39  15 - 41 U/L Final  . ALT 06/27/2019 28  0 - 44 U/L Final  . Alkaline Phosphatase 06/27/2019 54  38 - 126 U/L Final  . Total Bilirubin 06/27/2019 0.5  0.3 - 1.2 mg/dL Final    . GFR calc non Af Amer 06/27/2019 47* >60 mL/min Final  . GFR calc Af Amer 06/27/2019 54* >60 mL/min Final  . Anion gap 06/27/2019 11  5 - 15 Final   Performed at [facility], [address], [place], [place] [address]  . WBC 06/27/2019 17.5* 4.0 - 10.5 K/uL Final  . RBC 06/27/2019 3.94  3.87 - 5.11 MIL/uL Final  . Hemoglobin 06/27/2019 12.2  12.0 - 15.0 g/dL Final  . HCT 06/27/2019 35.9* 36.0 - 46.0 % Final  . MCV 06/27/2019 91.1  80.0 - 100.0 fL Final  . MCH 06/27/2019 31.0  26.0 - 34.0 pg Final  . MCHC 06/27/2019 34.0  30.0 - 36.0 g/dL Final  . RDW 06/27/2019 13.0  11.5 - 15.5 % Final  . Platelets 06/27/2019 201  150 - 400 K/uL Final  . nRBC 06/27/2019 0.0  0.0 - 0.2 % Final  . Neutrophils Relative % 06/27/2019 39  % Final  . Neutro Abs 06/27/2019 6.8  1.7 - 7.7 K/uL Final  . Lymphocytes Relative 06/27/2019 55  % Final  . Lymphs Abs 06/27/2019 9.5* 0.7 - 4.0 K/uL Final  . Monocytes Relative 06/27/2019 5  % Final  . Monocytes Absolute 06/27/2019 0.9  0.1 - 1.0 K/uL Final  . Eosinophils Relative 06/27/2019 1  % Final  . Eosinophils Absolute 06/27/2019 0.1  0.0 - 0.5 K/uL Final  . Basophils Relative 06/27/2019 0  % Final  . Basophils Absolute 06/27/2019 0.1  0.0 - 0.1 K/uL Final  . Immature Granulocytes 06/27/2019 0  % Final  . Abs Immature Granulocytes 06/27/2019 0.05  0.00 - 0.07 K/uL Final   Performed at [facility], [address] [place], [place] [address]    Assessment:  Emily Chaney is a 86 y.o. female with chronic lymphocytic leukemia (CLL)since 07/21/2018. WBC has ranged between 13,500 - 22,100. She has had lymphocytosis. She has had mild anemia (hemoglobin 11.6-11.8) and normal platelet count.  Work-up on 02/28/2019 revealeda hematocrit of 36.1, hemoglobin 12.4, MCV 89.8, platelets 197,000, white count 16,300 with an ANC of 6700.  Absolute lymphocyte count was 8400.  Reticulocyte count was 1.2%.  Normal studies included:  B12, folate,  ferritin (44), iron saturation, and sed rate.    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  Findings were consistent with a CD5+ B-cell lymphoproliferative disorder but phenotype was not completely typical for chronic lymphocytic leukemia (CLL)/small lymphocytic lymphoma (SLL) or mantle cell lymphoma (MCL).  Phenotype favored atypical CLL.  FISH revealed 85% of nuclei positive for trisomy 12. Negative studies include CCND1/IGH, ATM, 13 q and P53. Studies are consistent with CLL.  She denies recurrent infections. She denies oral or injected steroids. She does not smoke.  She had her last COVID-19 vaccine was on 10/24/2019.  Symptomatically, she denies any fevers, sweats or weight loss.  Exam reveals no adenopathy or hepatosplenomegaly.  Plan: 1.   Review [facility] labs from 10/23/2019.  2. Chronic lymphocytic leukenmia WBC 20,000 (ANC 5300; ALC 13,100).             She clinically, she is doing well.             Exam reveals no adenopathy or hepatosplenomegaly.               Continue surveillance every 6 months. 3. Mild anemia, resolved             Hematocrit 34.4.  Hemoglobin 11.2.  MCV 92.             B12, folate, ferritin and iron studies were normal on 02/28/2019.             Continue to monitor. 4.     RTC in 6 months for MD assessment and labs (CBC with diff, CMP, LDH, uric acid).  I discussed the assessment and treatment plan with the patient.  The patient was provided an opportunity to ask questions and all were answered.  The patient agreed with the plan and demonstrated an understanding of the instructions.  The patient was advised to call back if the symptoms worsen or if the condition fails to improve as anticipated.  I provided 12 minutes of face-to-face time during this this encounter and > 50% was spent counseling as documented under my assessment and plan.    Lequita Asal, MD, PhD      10/31/2019, 2:34 PM  I, Selena Batten, am acting as scribe for [employer]. Mike Gip, MD, PhD.  I, Melissa C. Mike Gip, MD, have reviewed the above documentation for accuracy and completeness, and I agree with the above.

## 2019-10-31 ENCOUNTER — Encounter: Payer: Self-pay | Admitting: Hematology and Oncology

## 2019-10-31 ENCOUNTER — Inpatient Hospital Stay (HOSPITAL_BASED_OUTPATIENT_CLINIC_OR_DEPARTMENT_OTHER): Payer: Medicare Other | Admitting: Hematology and Oncology

## 2019-10-31 ENCOUNTER — Other Ambulatory Visit: Payer: Self-pay

## 2019-10-31 VITALS — BP 187/75 | HR 73 | Temp 96.1°F | Resp 18 | Wt 112.4 lb

## 2019-10-31 DIAGNOSIS — I10 Essential (primary) hypertension: Secondary | ICD-10-CM | POA: Diagnosis not present

## 2019-10-31 DIAGNOSIS — Z7982 Long term (current) use of aspirin: Secondary | ICD-10-CM | POA: Diagnosis not present

## 2019-10-31 DIAGNOSIS — I2510 Atherosclerotic heart disease of native coronary artery without angina pectoris: Secondary | ICD-10-CM | POA: Diagnosis not present

## 2019-10-31 DIAGNOSIS — Z79899 Other long term (current) drug therapy: Secondary | ICD-10-CM | POA: Diagnosis not present

## 2019-10-31 DIAGNOSIS — C9110 Chronic lymphocytic leukemia of B-cell type not having achieved remission: Secondary | ICD-10-CM

## 2019-10-31 NOTE — Progress Notes (Signed)
Patients BP is elevated today (209/84, 187/75), she states she has white coat syndrome and when she took her BP at home it was 171/62. She states its been running high for about a week now and she needs to contact her PCP

## 2019-11-05 ENCOUNTER — Other Ambulatory Visit: Payer: Medicare Other | Admitting: Primary Care

## 2019-11-05 ENCOUNTER — Other Ambulatory Visit: Payer: Self-pay

## 2019-11-05 DIAGNOSIS — Z515 Encounter for palliative care: Secondary | ICD-10-CM

## 2019-11-05 NOTE — Progress Notes (Signed)
[facility] Pall[facility] Consult Note Telephone: [phone]  Fax: [phone]  TELEHEALTH VISIT STATEMENT Due to the COVID-19 crisis, this visit was done via telemedicine from my office. It was initiated and consented to by this patient and/or family.  PATIENT NAME: Emily Chaney [place] [address] [phone] (home)  DOB: [birth year] MRN: [id number]  PRIMARY CARE PROVIDER:   Barbaraann Boys, MD, [place] [address] [phone]  REFERRING PROVIDER:  Barbaraann Boys, [place],   [id number] [phone]  RESPONSIBLE PARTY:   Extended Emergency Contact Information Primary Emergency Contact: Glenice Laine [place] Phone: [phone] Relation: Daughter   ASSESSMENT AND RECOMMENDATIONS:   1. Advance Care Planning/Goals of Care: Goals include to maximize quality of life and symptom management.  2. Symptom Management:  I spoke with Emily Chaney today by phone at her request. She has not been seen by palliative since last summer. Several attempts were made to follow up. Today she states that she has been diagnosed with CLL. Her white count has been high and her doctor sent her to hem- oncologist. She says her brother had this as well and he's been passed away two years. She states she feels good now and is able to do things that she wants to do around the house. She said she has had both Covid shots and that her daughter who helps her recently had shoulder replacement surgery but she is able to assist Emily Chaney there with things that she may need. Overall Emily Chaney felt she was doing very well especially at her age and we discuss any further Palliative needs. I said I would likely check in with her every six months but that she could call in the interim if she needed anything.  3. Family /Caregiver/Community Supports: Live in [place] near her daughter who assists her.   4.  Cognitive / Functional decline: Alert, oriented x 3. (I) with many adls and some iadls.  5. Follow up Palliative Care Visit: Palliative care will continue to follow for goals of care clarification and symptom management. Return 6 months or prn.  I spent 15 minutes providing this consultation,  from 1130 to 1145. More than 50% of the time in this consultation was spent coordinating communication.   HISTORY OF PRESENT ILLNESS:  Emily Chaney is a 86 y.o. year old female with multiple medical problems including CLL, HTN, CAD, MI. Palliative Care was asked to follow this patient by consultation request of Barbaraann Boys, MD to help address advance care planning and goals of care. This is a follow up visit.  CODE STATUS: FULL  PPS: 60% HOSPICE ELIGIBILITY/DIAGNOSIS: TBD  PAST MEDICAL HISTORY:  Past Medical History:  Diagnosis Date  . Coronary artery disease   . Hypertension   . Osteoporosis   . Sciatica   . Shoulder injury, right, initial encounter     SOCIAL HX:  Social History   Tobacco Use  . Smoking status: Never Smoker  . Smokeless tobacco: Never Used  Substance Use Topics  . Alcohol use: Never    ALLERGIES:  Allergies  Allergen Reactions  . Amlodipine Rash  . Alendronate Other (See Comments)    Swallowing difficulty Swallowing difficulty   . Hydralazine Other (See Comments)    Other reaction(s): Dizziness lightheaded lightheaded   . Sulfa Antibiotics Other (See Comments)    Other Reaction: OTHER REACTION: NUMBNESS      PERTINENT MEDICATIONS:  Outpatient  Encounter Medications as of 11/05/2019  Medication Sig  . acetaminophen (TYLENOL) 325 MG tablet Take 325 mg by mouth every 6 (six) hours as needed.   Marland Kitchen aspirin EC 81 MG tablet Take 81 mg by mouth daily.   Marland Kitchen atorvastatin (LIPITOR) 40 MG tablet Take 40 mg by mouth daily at 6 PM.   . busPIRone (BUSPAR) 5 MG tablet Take 5 mg by mouth daily.   . Calcium 200 MG TABS Take 400 mg by mouth 2 (two) times a day.  .  carvedilol (COREG) 12.5 MG tablet Take by mouth 2 (two) times daily with a meal. 2 Tablets AM, 1 Tablet QHS  . chlorthalidone (HYGROTON) 25 MG tablet Take 12.5 mg by mouth 2 (two) times a day.   . lisinopril (ZESTRIL) 20 MG tablet Take 20 mg by mouth 2 (two) times a day.   . Multiple Vitamins-Minerals (MULTIVITAMIN ADULT PO) Take 1 tablet by mouth daily.    No facility-administered encounter medications on file as of 11/05/2019.    PHYSICAL EXAM / ROS:   Current and past weights: Recent 112 lbs, down from 114 this past summer General: NAD, frail, thin Cardiovascular: no chest pain reported Pulmonary: no cough, no increased SOB reported Abdomen: appetite fair GU: denies dysuria, incontinent of urine  MSK:  no joint deformities, ambulatory Skin: no rashes or wounds reported Neurological: Denies pain  Jason Coop, NP [facility]

## 2019-11-13 NOTE — Progress Notes (Signed)
This encounter was created in error - please disregard.

## 2020-01-31 ENCOUNTER — Telehealth: Payer: Self-pay | Admitting: *Deleted

## 2020-01-31 NOTE — Telephone Encounter (Signed)
Patient called reporting that she has had a lot of fatigue the past couple of weeks (otherwise she is fine) and is asking if there is anything that she can do for it. Please advise

## 2020-01-31 NOTE — Telephone Encounter (Signed)
Contacted patient. She has experienced fatigue for several weeks. She is no acute distress. She has no other symptoms: no acute illness, no weight loss, excellent appetite. She denies any night sweats or chills or fevers or enlarged lymph nodes.  She would like to know if you would like to see her any sooner than August?

## 2020-01-31 NOTE — Telephone Encounter (Signed)
  I would be happy to see her when it is convenient for her to help sort out the fatigue.  Nolon Stalls

## 2020-01-31 NOTE — Telephone Encounter (Signed)
  Please call patient.  M 

## 2020-02-05 NOTE — Progress Notes (Signed)
[facility]  [address], [address] [place], [place] [address] Phone: [phone]  Fax: [phone]   Clinic Day:  02/06/2020  Referring physician: Barbaraann Boys, MD  Chief Complaint: Emily Chaney is a 86 y.o. female with chronic lymphocytic leukemia (CLL) who is seen for a 4 month assessment secondary to fatigue.  HPI: The patient was last seen in the hematology clinic on 10/31/2019. At that time, she denied any fevers, sweats or weight loss. Exam revealed no adenopathy or hepatosplenomegaly.  She contacted the clinic on 01/31/2020 secondary to fatigue for several weeks.  During the interim, she "could be doing better".  She is surprised that her weight is so low today. She checked it a few days ago and it was 111 lb. She has been experiencing a lot of fatigue over the last few weeks.  She denies having changed any medications.  She is restless. She sleeps 2-3 hours before waking up again, and it is difficult to fall back asleep. She says she sleeps 5-6 hours per night. She says she has suffered from insomnia for a long time but recently it has been worse.  She denies having had any fevers or sweats. She denies seeing or feeling any lumps or bumps. She feels like her head 'can't wake up'. She denies bleeding or bruising anywhere. She thinks she has developed a UTI yesterday. She had a burning sensation and her urine was cloudy. She would like a prescription to relieve her pain and resolve the UTI. She hasn't had a chance to talk to her PCP about it.   She has received both doses of the COVID-19 vaccine in 10/2019.    Past Medical History:  Diagnosis Date  . Coronary artery disease   . Hypertension   . Osteoporosis   . Sciatica   . Shoulder injury, right, initial encounter     Past Surgical History:  Procedure Laterality Date  . CATARACT EXTRACTION Bilateral    x 2   . cervical spinal fusion    . STENT PLACEMENT VASCULAR ([facility] HX)      Family History    Problem Relation Age of Onset  . Cancer Mother     Social History:  reports that she has never smoked. She has never used smokeless tobacco. She reports that she does not drink alcohol or use drugs.  She denies exposure to radiation and toxins. She is originally from [place], and lived in [place] for 22 years. She[place]lives in [place]. The patient is accompanied by her daughter today.  Allergies:  Allergies  Allergen Reactions  . Amlodipine Rash  . Alendronate Other (See Comments)    Swallowing difficulty Swallowing difficulty   . Hydralazine Other (See Comments)    Other reaction(s): Dizziness lightheaded lightheaded   . Sulfa Antibiotics Other (See Comments)    Other Reaction: OTHER REACTION: NUMBNESS     Current Medications: Current Outpatient Medications  Medication Sig Dispense Refill  . acetaminophen (TYLENOL) 325 MG tablet Take 325 mg by mouth every 6 (six) hours as needed.     Marland Kitchen aspirin EC 81 MG tablet Take 81 mg by mouth daily.     Marland Kitchen atorvastatin (LIPITOR) 40 MG tablet Take 40 mg by mouth daily at 6 PM.     . busPIRone (BUSPAR) 5 MG tablet Take 5 mg by mouth daily.     . Calcium 200 MG TABS Take 400 mg by mouth 2 (two) times a day.    . chlorthalidone (HYGROTON) 25  MG tablet Take by mouth.    Marland Kitchen lisinopril (ZESTRIL) 20 MG tablet Take 20 mg by mouth 2 (two) times a day.     . Multiple Vitamins-Minerals (MULTIVITAMIN ADULT PO) Take 1 tablet by mouth daily.     . carvedilol (COREG) 12.5 MG tablet Take by mouth 2 (two) times daily with a meal. 2 Tablets AM, 1 Tablet QHS    . chlorthalidone (HYGROTON) 25 MG tablet Take 12.5 mg by mouth 2 (two) times a day.      No current facility-administered medications for this visit.    Review of Systems  Constitutional: Positive for malaise/fatigue and weight loss (8 lb). Negative for chills and fever.       "Could be better"  HENT: Positive for hearing loss (hearing aids). Negative for congestion, ear discharge, ear  pain, sinus pain and sore throat.   Eyes: Negative.  Negative for blurred vision, double vision and photophobia.  Respiratory: Negative.  Negative for cough, hemoptysis, shortness of breath and wheezing.   Cardiovascular: Negative.  Negative for chest pain, palpitations and leg swelling.  Gastrointestinal: Negative.  Negative for abdominal pain, blood in stool, constipation, diarrhea, heartburn, melena, nausea and vomiting.  Genitourinary: Positive for dysuria. Negative for frequency, hematuria and urgency.       She suspects she has developed a UTI due to burning sensation and cloudy urine.   Musculoskeletal: Negative.  Negative for back pain, falls, joint pain and myalgias.  Skin: Negative.  Negative for itching and rash.  Neurological: Negative for dizziness, tingling, sensory change, speech change, focal weakness, weakness and headaches.       Sciatica, chronic.  Endo/Heme/Allergies: Negative.  Negative for environmental allergies. Does not bruise/bleed easily.  Psychiatric/Behavioral: Negative for depression and memory loss. The patient has insomnia. The patient is not nervous/anxious.   All other systems reviewed and are negative.  Performance status (ECOG): 1 - Symptomatic but completely ambulatory  Vitals Blood pressure (!) 186/70, pulse 60, temperature 98.2 F (36.8 C), temperature source Oral, resp. rate 16, weight 104 lb 1.6 oz (47.2 kg), SpO2 99 %.   Physical Exam Vitals and nursing note reviewed.  Constitutional:      General: She is not in acute distress.    Appearance: Normal appearance. She is well-developed and well-nourished. She is not diaphoretic.     Interventions: Face mask in place.     Comments: She has a cane by her side. Requires assistance on and off exam room table.   HENT:     Head: Normocephalic and atraumatic.     Mouth/Throat:     Mouth: Oropharynx is clear and moist and mucous membranes are normal. No oral lesions.     Pharynx: No oropharyngeal  exudate.      Comments: Short gray hair.  Mask. Eyes:     General: No scleral icterus.       Right eye: No discharge.        Left eye: No discharge.     Extraocular Movements: EOM normal.     Conjunctiva/sclera: Conjunctivae normal.     Pupils: Pupils are equal, round, and reactive to light.     Comments: Blue eyes.  Neck:     Vascular: No JVD.  Cardiovascular:     Rate and Rhythm: Normal rate and regular rhythm.     Pulses: Intact distal pulses.     Heart sounds: Normal heart sounds. No murmur heard.  No friction rub. No gallop.   Pulmonary:  Effort: Pulmonary effort is normal. No respiratory distress.     Breath sounds: Normal breath sounds. No wheezing, rhonchi or rales.  Chest:     Chest wall: No tenderness.  Abdominal:     General: There is no distension.     Palpations: There is no hepatosplenomegaly or mass.     Tenderness: There is no abdominal tenderness. There is no CVA tenderness, guarding or rebound.  Musculoskeletal:        General: No tenderness or edema. Normal range of motion.     Cervical back: Normal range of motion and neck supple.  Lymphadenopathy:     Head:     Right side of head: No preauricular, posterior auricular or occipital adenopathy.     Left side of head: No preauricular, posterior auricular or occipital adenopathy.     Cervical: No cervical adenopathy.     Right cervical: No superficial cervical adenopathy.    Upper Body:  No axillary adenopathy present.    Right upper body: No supraclavicular adenopathy.     Left upper body: No supraclavicular adenopathy.     Lower Body: No right inguinal adenopathy. No left inguinal adenopathy.  Skin:    General: Skin is warm, dry and intact.     Coloration: Skin is not pale.     Findings: No bruising, erythema, lesion or rash.  Neurological:     Mental Status: She is alert and oriented to person, place, and time.     Deep Tendon Reflexes: Reflexes are normal and symmetric.  Psychiatric:        Mood  and Affect: Mood and affect normal.        Behavior: Behavior normal.        Thought Content: Thought content normal.        Judgment: Judgment normal.    No visits with results within 3 Day(s) from this visit.  Latest known visit with results is:  Appointment on 06/27/2019  Component Date Value Ref Range Status  . LDH 06/27/2019 160  98 - 192 U/L Final   Performed at [facility], [address], [place], [place] [address]  . Uric Acid, Serum 06/27/2019 6.6  2.5 - 7.1 mg/dL Final   Performed at [facility], [address] [place], [place] [address]  . Sodium 06/27/2019 128* 135 - 145 mmol/L Final  . Potassium 06/27/2019 4.3  3.5 - 5.1 mmol/L Final  . Chloride 06/27/2019 91* 98 - 111 mmol/L Final  . CO2 06/27/2019 26  22 - 32 mmol/L Final  . Glucose, Bld 06/27/2019 145* 70 - 99 mg/dL Final  . BUN 06/27/2019 36* 8 - 23 mg/dL Final  . Creatinine, Ser 06/27/2019 1.04* 0.44 - 1.00 mg/dL Final  . Calcium 06/27/2019 10.2  8.9 - 10.3 mg/dL Final  . Total Protein 06/27/2019 7.7  6.5 - 8.1 g/dL Final  . Albumin 06/27/2019 4.3  3.5 - 5.0 g/dL Final  . AST 06/27/2019 39  15 - 41 U/L Final  . ALT 06/27/2019 28  0 - 44 U/L Final  . Alkaline Phosphatase 06/27/2019 54  38 - 126 U/L Final  . Total Bilirubin 06/27/2019 0.5  0.3 - 1.2 mg/dL Final  . GFR calc non Af Amer 06/27/2019 47* >60 mL/min Final  . GFR calc Af Amer 06/27/2019 54* >60 mL/min Final  . Anion gap 06/27/2019 11  5 - 15 Final   Performed at [facility], [address] [place], [place] [address]  .  WBC 06/27/2019 17.5* 4.0 - 10.5 K/uL Final  . RBC 06/27/2019 3.94  3.87 - 5.11 MIL/uL Final  . Hemoglobin 06/27/2019 12.2  12.0 - 15.0 g/dL Final  . HCT 06/27/2019 35.9* 36.0 - 46.0 % Final  . MCV 06/27/2019 91.1  80.0 - 100.0 fL Final  . MCH 06/27/2019 31.0  26.0 - 34.0 pg Final  . MCHC 06/27/2019 34.0  30.0 - 36.0 g/dL Final  . RDW 06/27/2019 13.0  11.5 - 15.5 % Final  . Platelets  06/27/2019 201  150 - 400 K/uL Final  . nRBC 06/27/2019 0.0  0.0 - 0.2 % Final  . Neutrophils Relative % 06/27/2019 39  % Final  . Neutro Abs 06/27/2019 6.8  1.7 - 7.7 K/uL Final  . Lymphocytes Relative 06/27/2019 55  % Final  . Lymphs Abs 06/27/2019 9.5* 0.7 - 4.0 K/uL Final  . Monocytes Relative 06/27/2019 5  % Final  . Monocytes Absolute 06/27/2019 0.9  0.1 - 1.0 K/uL Final  . Eosinophils Relative 06/27/2019 1  % Final  . Eosinophils Absolute 06/27/2019 0.1  0.0 - 0.5 K/uL Final  . Basophils Relative 06/27/2019 0  % Final  . Basophils Absolute 06/27/2019 0.1  0.0 - 0.1 K/uL Final  . Immature Granulocytes 06/27/2019 0  % Final  . Abs Immature Granulocytes 06/27/2019 0.05  0.00 - 0.07 K/uL Final   Performed at [facility], [address], [place], [place] [address]    Assessment:  Emily Chaney is a 86 y.o. female with chronic lymphocytic leukemia (CLL)since 07/21/2018. WBC has ranged between 13,500 - 22,100. She has had lymphocytosis. She has had mild anemia (hemoglobin 11.6-11.8) and normal platelet count.  Work-up on 02/28/2019 revealeda hematocrit of 36.1, hemoglobin 12.4, MCV 89.8, platelets 197,000, white count 16,300 with an ANC of 6700.  Absolute lymphocyte count was 8400.  Reticulocyte count was 1.2%.  Normal studies included:  B12, folate, ferritin (44), iron saturation, and sed rate.    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  Findings were consistent with a CD5+ B-cell lymphoproliferative disorder but phenotype was not completely typical for chronic lymphocytic leukemia (CLL)/small lymphocytic lymphoma (SLL) or mantle cell lymphoma (MCL).  Phenotype favored atypical CLL.  FISH revealed 85% of nuclei positive for trisomy 12. Negative studies include CCND1/IGH, ATM, 13 q and P53. Studies are consistent with CLL.  She denies recurrent infections. She denies oral or injected steroids. She does not  smoke.  She received her last COVID-19 vaccine on 10/24/2019.  Symptomatically, she is fatigued.  She has ongoing issues with insomnia.  She denies any fevers or sweats.  She has symptoms of a UTI.  Weight is down 8 pounds.  Exam reveals no adenopathy or hepatosplenomegaly.  Plan: 1.   Labs today:  CBC with diff, CMP, TSH, ferritin, iron studies, retic, LDH, uric acid.  2.   UA and culture. 3. Chronic lymphocytic leukenmia WBC 23,800 ([place]ason 8900; ALC 13,300).             LDH is 154 (normal).  She is fatigued.  She has lost weight.             Exam reveals no adenopathy or hepatosplenomegaly.               Consider imaging if etiology of fatigue unclear or does not resolve. 3. Mild normocytic anemia             Hematocrit 34.3.  Hemoglobin  11.6.  MCV 91.2.             Ferritin 48 with an iron saturation of 13% and a TIBC 381.  B12, folate, ferritin and iron studies were normal on 02/28/2019.             Continue to monitor. 4.   UTI symptoms  UA and culture. 5.   Fatigue  Etiology unclear.  Suspect insomnia and UTI contributing.  Check TSH. 6.   RTC as previously scheduled.  I discussed the assessment and treatment plan with the patient.  The patient was provided an opportunity to ask questions and all were answered.  The patient agreed with the plan and demonstrated an understanding of the instructions.  The patient was advised to call back if the symptoms worsen or if the condition fails to improve as anticipated.   Lequita Asal, MD, PhD    02/06/2020, 3:35 PM  I, Heywood Footman, am acting as a scribe for Lequita Asal, MD, PhD.   I, Numa Schroeter C. Mike Gip, MD, have reviewed the above documentation for accuracy and completeness, and I agree with the above.

## 2020-02-06 ENCOUNTER — Other Ambulatory Visit: Payer: Self-pay

## 2020-02-06 ENCOUNTER — Inpatient Hospital Stay: Payer: Medicare Other | Attending: Hematology and Oncology | Admitting: Hematology and Oncology

## 2020-02-06 ENCOUNTER — Encounter: Payer: Self-pay | Admitting: Hematology and Oncology

## 2020-02-06 ENCOUNTER — Other Ambulatory Visit: Payer: Self-pay | Admitting: Hematology and Oncology

## 2020-02-06 ENCOUNTER — Inpatient Hospital Stay: Payer: Medicare Other

## 2020-02-06 VITALS — BP 186/70 | HR 60 | Temp 98.2°F | Resp 16 | Wt 104.1 lb

## 2020-02-06 DIAGNOSIS — C9110 Chronic lymphocytic leukemia of B-cell type not having achieved remission: Secondary | ICD-10-CM | POA: Diagnosis present

## 2020-02-06 DIAGNOSIS — G4700 Insomnia, unspecified: Secondary | ICD-10-CM | POA: Diagnosis not present

## 2020-02-06 DIAGNOSIS — D649 Anemia, unspecified: Secondary | ICD-10-CM

## 2020-02-06 DIAGNOSIS — R300 Dysuria: Secondary | ICD-10-CM | POA: Diagnosis not present

## 2020-02-06 DIAGNOSIS — N390 Urinary tract infection, site not specified: Secondary | ICD-10-CM | POA: Diagnosis not present

## 2020-02-06 DIAGNOSIS — R5383 Other fatigue: Secondary | ICD-10-CM

## 2020-02-06 DIAGNOSIS — R7989 Other specified abnormal findings of blood chemistry: Secondary | ICD-10-CM

## 2020-02-06 LAB — COMPREHENSIVE METABOLIC PANEL
ALT: 28 U/L (ref 0–44)
AST: 37 U/L (ref 15–41)
Albumin: 4.2 g/dL (ref 3.5–5.0)
Alkaline Phosphatase: 52 U/L (ref 38–126)
Anion gap: 9 (ref 5–15)
BUN: 37 mg/dL — ABNORMAL HIGH (ref 8–23)
CO2: 27 mmol/L (ref 22–32)
Calcium: 9.9 mg/dL (ref 8.9–10.3)
Chloride: 93 mmol/L — ABNORMAL LOW (ref 98–111)
Creatinine, Ser: 1.09 mg/dL — ABNORMAL HIGH (ref 0.44–1.00)
GFR calc Af Amer: 51 mL/min — ABNORMAL LOW (ref >60)
GFR calc non Af Amer: 44 mL/min — ABNORMAL LOW (ref >60)
Glucose, Bld: 100 mg/dL — ABNORMAL HIGH (ref 70–99)
Potassium: 4 mmol/L (ref 3.5–5.1)
Sodium: 129 mmol/L — ABNORMAL LOW (ref 135–145)
Total Bilirubin: 0.7 mg/dL (ref 0.3–1.2)
Total Protein: 7.3 g/dL (ref 6.5–8.1)

## 2020-02-06 LAB — URINALYSIS, COMPLETE (UACMP) WITH MICROSCOPIC
Bilirubin Urine: NEGATIVE
Glucose, UA: NEGATIVE mg/dL
Ketones, ur: NEGATIVE mg/dL
Nitrite: NEGATIVE
Protein, ur: NEGATIVE mg/dL
Specific Gravity, Urine: 1.01 (ref 1.005–1.030)
pH: 6.5 (ref 5.0–8.0)

## 2020-02-06 LAB — CBC WITH DIFFERENTIAL/PLATELET
Abs Immature Granulocytes: 0.08 10*3/uL — ABNORMAL HIGH (ref 0.00–0.07)
Basophils Absolute: 0.1 10*3/uL (ref 0.0–0.1)
Basophils Relative: 0 %
Eosinophils Absolute: 0.1 10*3/uL (ref 0.0–0.5)
Eosinophils Relative: 1 %
HCT: 34.3 % — ABNORMAL LOW (ref 36.0–46.0)
Hemoglobin: 11.6 g/dL — ABNORMAL LOW (ref 12.0–15.0)
Immature Granulocytes: 0 %
Lymphocytes Relative: 56 %
Lymphs Abs: 13.3 10*3/uL — ABNORMAL HIGH (ref 0.7–4.0)
MCH: 30.9 pg (ref 26.0–34.0)
MCHC: 33.8 g/dL (ref 30.0–36.0)
MCV: 91.2 fL (ref 80.0–100.0)
Monocytes Absolute: 1.3 10*3/uL — ABNORMAL HIGH (ref 0.1–1.0)
Monocytes Relative: 5 %
Neutro Abs: 8.9 10*3/uL — ABNORMAL HIGH (ref 1.7–7.7)
Neutrophils Relative %: 38 %
Platelets: 177 10*3/uL (ref 150–400)
RBC: 3.76 MIL/uL — ABNORMAL LOW (ref 3.87–5.11)
RDW: 13.6 % (ref 11.5–15.5)
WBC: 23.8 10*3/uL — ABNORMAL HIGH (ref 4.0–10.5)
nRBC: 0 % (ref 0.0–0.2)

## 2020-02-06 LAB — LACTATE DEHYDROGENASE: LDH: 154 U/L (ref 98–192)

## 2020-02-06 LAB — RETICULOCYTES
Immature Retic Fract: 8.9 % (ref 2.3–15.9)
RBC.: 3.75 MIL/uL — ABNORMAL LOW (ref 3.87–5.11)
Retic Count, Absolute: 59.6 10*3/uL (ref 19.0–186.0)
Retic Ct Pct: 1.6 % (ref 0.4–3.1)

## 2020-02-06 LAB — URIC ACID: Uric Acid, Serum: 6.6 mg/dL (ref 2.5–7.1)

## 2020-02-06 NOTE — Progress Notes (Signed)
Patient here for oncology follow-up appointment, concerns of burning in urine and cloudiness. Extreme fatigue and pressure in head.

## 2020-02-07 ENCOUNTER — Telehealth: Payer: Self-pay | Admitting: *Deleted

## 2020-02-07 ENCOUNTER — Other Ambulatory Visit: Payer: Self-pay

## 2020-02-07 DIAGNOSIS — R7989 Other specified abnormal findings of blood chemistry: Secondary | ICD-10-CM

## 2020-02-07 DIAGNOSIS — R5383 Other fatigue: Secondary | ICD-10-CM

## 2020-02-07 LAB — IRON AND TIBC
Iron: 49 ug/dL (ref 28–170)
Saturation Ratios: 13 % (ref 10.4–31.8)
TIBC: 381 ug/dL (ref 250–450)
UIBC: 332 ug/dL

## 2020-02-07 LAB — T4, FREE: Free T4: 1.44 ng/dL — ABNORMAL HIGH (ref 0.61–1.12)

## 2020-02-07 LAB — TSH: TSH: 5.253 u[IU]/mL — ABNORMAL HIGH (ref 0.350–4.500)

## 2020-02-07 LAB — FERRITIN: Ferritin: 48 ng/mL (ref 11–307)

## 2020-02-07 MED ORDER — CEPHALEXIN 250 MG PO CAPS: 250.0000 mg | ORAL_CAPSULE | Freq: Three times a day (TID) | ORAL | 0 refills | Status: DC

## 2020-02-07 NOTE — Telephone Encounter (Signed)
Patient called reporting that Dr Mike Gip told her she was going to send a prescription in for UTI and nothing has been sent. Please advise

## 2020-02-09 LAB — URINE CULTURE: Culture: 40000 — AB

## 2020-02-12 ENCOUNTER — Telehealth: Payer: Self-pay

## 2020-02-12 NOTE — Telephone Encounter (Signed)
Sent to dr Janene Harvey

## 2020-02-12 NOTE — Telephone Encounter (Signed)
-----   Message from Lequita Asal, MD sent at 02/08/2020  4:41 PM EDT ----- Regarding: Please forward to her PCP (Dr Janene Harvey)  ----- Message ----- From: Buel Ream, Lab [facility] [place] Sent: 02/06/2020   4:12 PM EDT To: Lequita Asal, MD

## 2020-03-17 ENCOUNTER — Telehealth: Payer: Self-pay

## 2020-03-17 NOTE — Telephone Encounter (Signed)
02/06/20: Palliative volunteer check in call attempt, no answer 

## 2020-03-22 ENCOUNTER — Encounter: Payer: Self-pay | Admitting: Emergency Medicine

## 2020-03-22 ENCOUNTER — Other Ambulatory Visit: Payer: Self-pay

## 2020-03-22 ENCOUNTER — Ambulatory Visit
Admission: EM | Admit: 2020-03-22 | Discharge: 2020-03-22 | Disposition: A | Discharge status: Home / Self Care | Payer: Medicare Other | Attending: Family Medicine | Admitting: Family Medicine

## 2020-03-22 DIAGNOSIS — N390 Urinary tract infection, site not specified: Secondary | ICD-10-CM | POA: Diagnosis not present

## 2020-03-22 DIAGNOSIS — R319 Hematuria, unspecified: Secondary | ICD-10-CM

## 2020-03-22 LAB — URINALYSIS, COMPLETE (UACMP) WITH MICROSCOPIC
Bilirubin Urine: NEGATIVE
Glucose, UA: NEGATIVE mg/dL
Ketones, ur: NEGATIVE mg/dL
Nitrite: POSITIVE — AB
Protein, ur: 30 mg/dL — AB
RBC / HPF: >50 RBC/hpf (ref 0–5)
Specific Gravity, Urine: 1.015 (ref 1.005–1.030)
WBC, UA: >50 WBC/hpf (ref 0–5)
pH: 6.5 (ref 5.0–8.0)

## 2020-03-22 MED ORDER — CEPHALEXIN 250 MG PO CAPS
250.0000 mg | ORAL_CAPSULE | Freq: Two times a day (BID) | ORAL | 0 refills | Status: DC
Start: 2020-03-22 — End: 2020-10-28

## 2020-03-22 NOTE — ED Provider Notes (Signed)
[facility]    CSN: [id number] Arrival date & time: 03/22/20  1057      History   Chief Complaint Chief Complaint  Patient presents with  . Dysuria  . Urinary Frequency    HPI Emily Chaney is a 86 y.o. female.   86 yo female with a c/o burning with urination, frequent urination and cloudy urine since yesterday. Denies any fevers, chills, abdominal or flank pain, nausea, vomiting. Has been drinking more water today.    Dysuria Urinary Frequency    Past Medical History:  Diagnosis Date  . Coronary artery disease   . Hypertension   . Osteoporosis   . Sciatica   . Shoulder injury, right, initial encounter     Patient Active Problem List   Diagnosis Date Noted  . Anemia 02/06/2020  . Fatigue 02/06/2020  . Lymphoproliferative disorder ([place]) 03/08/2019  . Chronic lymphocytic leukemia ([place]) 02/28/2019    Past Surgical History:  Procedure Laterality Date  . CATARACT EXTRACTION Bilateral    x 2   . cervical spinal fusion    . STENT PLACEMENT VASCULAR ([facility] HX)      OB History   No obstetric history on file.      Home Medications    Prior to Admission medications   Medication Sig Start Date End Date Taking? Authorizing Provider  aspirin EC 81 MG tablet Take 81 mg by mouth daily.    Yes [provider]  atorvastatin (LIPITOR) 40 MG tablet Take 40 mg by mouth daily at 6 PM.  01/16/19  Yes [provider]  busPIRone (BUSPAR) 5 MG tablet Take 5 mg by mouth daily.  06/09/19  Yes [provider]  Calcium 200 MG TABS Take 400 mg by mouth 2 (two) times a day.   Yes [provider]  carvedilol (COREG) 12.5 MG tablet Take by mouth 2 (two) times daily with a meal. 2 Tablets AM, 1 Tablet QHS 11/15/16 03/22/20 Yes [provider]  chlorthalidone (HYGROTON) 25 MG tablet Take by mouth. 11/13/19  Yes [provider]  lisinopril (ZESTRIL) 20 MG tablet Take 20 mg by mouth 2 (two) times a day.  01/19/19  Yes [provider]  Multiple Vitamins-Minerals (MULTIVITAMIN ADULT PO) Take 1 tablet by mouth daily.  12/31/10  Yes [provider]  acetaminophen (TYLENOL) 325 MG tablet Take 325 mg by mouth every 6 (six) hours as needed.     [provider]  cephALEXin (KEFLEX) 250 MG capsule Take 1 capsule (250 mg total) by mouth 2 (two) times daily. 03/22/20   Norval Gable, MD  chlorthalidone (HYGROTON) 25 MG tablet Take 12.5 mg by mouth 2 (two) times a day.  01/28/17 09/26/19  [provider]    Family History Family History  Problem Relation Age of Onset  . Cancer Mother     Social History Social History   Tobacco Use  . Smoking status: Never Smoker  . Smokeless tobacco: Never Used  Vaping Use  . Vaping Use: Never used  Substance Use Topics  . Alcohol use: Never  . Drug use: Never     Allergies   Amlodipine, Alendronate, Hydralazine, and Sulfa antibiotics   Review of Systems Review of Systems  Genitourinary: Positive for dysuria and frequency.     Physical Exam Triage Vital Signs ED Triage Vitals  Enc Vitals Group     BP 03/22/20 1124 (!) 182/70     Pulse Rate 03/22/20 1124 60     Resp  03/22/20 1124 14     Temp 03/22/20 1124 98.4 F (36.9 C)     Temp Source 03/22/20 1124 Oral     SpO2 03/22/20 1124 99 %     Weight 03/22/20 1120 111 lb (50.3 kg)     Height 03/22/20 1120 5' (1.524 m)     Head Circumference --      Peak Flow --      Pain Score 03/22/20 1120 4     Pain Loc --      Pain Edu? --      Excl. in [place]? --    No data found.  Updated Vital Signs BP (!) 182/70 (BP Location: Right Arm)   Pulse 60   Temp 98.4 F (36.9 C) (Oral)   Resp 14   Ht 5' (1.524 m)   Wt 50.3 kg   SpO2 99%   BMI 21.68 kg/m   Visual Acuity Right Eye Distance:   Left Eye Distance:   Bilateral Distance:    Right Eye Near:   Left Eye Near:    Bilateral Near:     Physical Exam Vitals and nursing note reviewed.  Constitutional:      General: She is not in  acute distress.    Appearance: She is not toxic-appearing or diaphoretic.  Cardiovascular:     Rate and Rhythm: Normal rate.  Pulmonary:     Effort: Pulmonary effort is normal. No respiratory distress.  Abdominal:     General: There is no distension.     Palpations: Abdomen is soft.  Neurological:     Mental Status: She is alert.      UC Treatments / Results  Labs (all labs ordered are listed, but only abnormal results are displayed) Labs Reviewed  URINALYSIS, COMPLETE (UACMP) WITH MICROSCOPIC - Abnormal; Notable for the following components:      Result Value   APPearance CLOUDY (*)    Hgb urine dipstick MODERATE (*)    Protein, ur 30 (*)    Nitrite POSITIVE (*)    Leukocytes,Ua LARGE (*)    Bacteria, UA MANY (*)    All other components within normal limits  URINE CULTURE    EKG   Radiology No results found.  Procedures Procedures (including critical care time)  Medications Ordered in UC Medications - No data to display  Initial Impression / Assessment and Plan / UC Course  I have reviewed the triage vital signs and the nursing notes.  Pertinent labs & imaging results that were available during my care of the patient were reviewed by me and considered in my medical decision making (see chart for details).      Final Clinical Impressions(s) / UC Diagnoses   Final diagnoses:  Urinary tract infection with hematuria, site unspecified     Discharge Instructions     Increase water intake    ED Prescriptions    Medication Sig Dispense Auth. Provider   cephALEXin (KEFLEX) 250 MG capsule Take 1 capsule (250 mg total) by mouth 2 (two) times daily. 14 capsule Norval Gable, MD     1. Lab results and diagnosis reviewed with patient 2. rx as per orders above; reviewed possible side effects, interactions, risks and benefits  3. Recommend supportive treatment with increased water intake 4. Follow-up prn if symptoms worsen or don't improve   PDMP not  reviewed this encounter.   Norval Gable, MD 03/22/20 1513

## 2020-03-22 NOTE — ED Triage Notes (Signed)
Patient c/o burning when urinating, cloudy urine and urinary frequency that started last night.

## 2020-03-22 NOTE — Discharge Instructions (Addendum)
Increase water intake

## 2020-03-24 ENCOUNTER — Telehealth (HOSPITAL_COMMUNITY): Payer: Self-pay | Admitting: Emergency Medicine

## 2020-03-24 LAB — URINE CULTURE: Culture: >=100000 — AB

## 2020-03-24 NOTE — Telephone Encounter (Signed)
Urine culture was positive for ecoli and was given  keflex at urgent care visit. Pt contacted and made aware, educated on completing antibiotic and to follow up if symptoms are persistent. Verbalized understanding. VM left to return call if problems

## 2020-03-28 ENCOUNTER — Telehealth: Payer: Self-pay | Admitting: Primary Care

## 2020-03-28 NOTE — Telephone Encounter (Signed)
T/c to Mrs Cutrone to offer home palliative medicine visit. No answer, message left.

## 2020-04-03 ENCOUNTER — Telehealth: Payer: Self-pay | Admitting: Primary Care

## 2020-04-03 NOTE — Telephone Encounter (Signed)
T/c back from Emily Chaney, who states she has a lot of fatigue and exhaustion. Has had a UTI which has been treated. States she feels she is doing pretty well in light of her age. We discussed her needing PC visits but she felt this was not needed at this time. She states she wants to called periodically and will set up visits if needed.

## 2020-04-29 NOTE — Progress Notes (Signed)
[facility]  [address] [place], [place] [address] Phone: [phone]  Fax: [phone]   Clinic Day:  04/30/2020  Referring physician: Barbaraann Boys, MD  Chief Complaint: Emily Chaney is a 86 y.o. female with chronic lymphocytic leukemia (CLL) who is seen for 3 month assessment.  HPI: The patient was last seen in the hematology clinic on 02/06/2020. At that time, she was fatigued.  She had ongoing issues with insomnia.  She denied any fevers or sweats.  She had symptoms of a UTI.  Weight was down 8 pounds.  Exam revealed no adenopathy or hepatosplenomegaly.   Labs at last visit revealed a hematocrit of 34.3, hemoglobin 11.6, MCV 91.2, platelets 177,000, WBC 23,800 (ANC 8,900). Sodium was 129. BUN was 37 and creatinine 1.09 (CrCl 44 ml/min). Ferritin was 48 with an iron saturation of 13% and a TIBC of 381. Reticulocyte count was 1.6%. TSH was 5.253 and free T4 was 1.44. Uric acid was 6.6. LDH was 154.    [facility] labs on 02/13/2020 revealed a TSH of 4.58 and a free T4 of 0.93 (normal).  During the interim, she has been "ok".  She has been exhausted the past couple of months, though she notes that the past 4 days have been better. Her appetite is okay. She has on and off insomnia. She denies new lumps and bumps, bruising, bleeding, nausea, vomiting, diarrhea, chest pain, cough, and shortness of breath. Her sciatica has been better recently.  The patient has had a UTI for a couple of months. She is currently on Augmentin, which is the third medication she has tried. This has been managed by her PCP, but she is seeing a urologist next week. Her daughter notes that she is seeing an OBGYN for prolapse and they are having trouble finding a pessary that fits.   Past Medical History:  Diagnosis Date  . Coronary artery disease   . Hypertension   . Osteoporosis   . Sciatica   . Shoulder injury, right, initial encounter     Past Surgical History:  Procedure  Laterality Date  . CATARACT EXTRACTION Bilateral    x 2   . cervical spinal fusion    . STENT PLACEMENT VASCULAR ([facility] HX)      Family History  Problem Relation Age of Onset  . Cancer Mother     Social History:  reports that she has never smoked. She has never used smokeless tobacco. She reports that she does not drink alcohol and does not use drugs.  She denies exposure to radiation and toxins. She is originally from [place], and lived in [place] for 22 years. [place] in [place]. The patient is accompanied by her daughter today.  Allergies:  Allergies  Allergen Reactions  . Amlodipine Rash  . Alendronate Other (See Comments)    Swallowing difficulty Swallowing difficulty   . Hydralazine Other (See Comments)    Other reaction(s): Dizziness lightheaded lightheaded   . Sulfa Antibiotics Other (See Comments)    Other Reaction: OTHER REACTION: NUMBNESS     Current Medications: Current Outpatient Medications  Medication Sig Dispense Refill  . acetaminophen (TYLENOL) 325 MG tablet Take 325 mg by mouth every 6 (six) hours as needed.     Marland Kitchen amoxicillin-clavulanate (AUGMENTIN) 875-125 MG tablet Take by mouth.    Marland Kitchen aspirin EC 81 MG tablet Take 81 mg by mouth daily.     Marland Kitchen atorvastatin (LIPITOR) 40 MG tablet Take 40 mg by mouth daily at 6 PM.     .  busPIRone (BUSPAR) 5 MG tablet Take 5 mg by mouth daily.     . Calcium 200 MG TABS Take 400 mg by mouth 2 (two) times a day.    . carvedilol (COREG) 12.5 MG tablet Take by mouth 2 (two) times daily with a meal. 2 Tablets AM, 1 Tablet QHS    . chlorthalidone (HYGROTON) 25 MG tablet Take 25 mg by mouth 2 (two) times a day. 1 whole tab in the morning( 25 mg) and 1/2 tab in the afternoon ( 12.5)    . lisinopril (ZESTRIL) 20 MG tablet Take 20 mg by mouth 2 (two) times a day.     . Multiple Vitamins-Minerals (MULTIVITAMIN ADULT PO) Take 1 tablet by mouth daily.     . cephALEXin (KEFLEX) 250 MG capsule Take 1 capsule (250 mg total)  by mouth 2 (two) times daily. (Patient not taking: Reported on 04/30/2020) 14 capsule 0  . chlorthalidone (HYGROTON) 25 MG tablet Take by mouth. (Patient not taking: Reported on 04/30/2020)     No current facility-administered medications for this visit.    Review of Systems  Constitutional: Positive for malaise/fatigue (exhausted, low energy). Negative for chills, fever and weight loss (up 4 lbs).       Feels "ok" today.  HENT: Positive for hearing loss (hearing aids). Negative for congestion, ear discharge, ear pain, nosebleeds, sinus pain, sore throat and tinnitus.   Eyes: Negative.  Negative for blurred vision, double vision and photophobia.  Respiratory: Negative.  Negative for cough, hemoptysis, sputum production, shortness of breath and stridor.   Cardiovascular: Negative.  Negative for chest pain, palpitations and leg swelling.  Gastrointestinal: Negative.  Negative for abdominal pain, blood in stool, constipation, diarrhea, heartburn, melena, nausea and vomiting.  Genitourinary: Negative for dysuria, frequency, hematuria and urgency.       UTI  Musculoskeletal: Negative.  Negative for back pain, joint pain, myalgias and neck pain.  Skin: Negative.  Negative for itching and rash.  Neurological: Negative for dizziness, tingling, sensory change, speech change, focal weakness, weakness and headaches.       Sciatica, chronic.  Endo/Heme/Allergies: Negative.  Negative for environmental allergies. Does not bruise/bleed easily.  Psychiatric/Behavioral: Negative for depression and memory loss. The patient has insomnia (on and off). The patient is not nervous/anxious.   All other systems reviewed and are negative.  Performance status (ECOG): 1  Vitals Blood pressure (!) 171/81, pulse 65, temperature 97.8 F (36.6 C), temperature source Tympanic, resp. rate 18, height 5' (1.524 m), weight 108 lb 14.5 oz (49.4 kg), SpO2 99 %.   Physical Exam Vitals and nursing note reviewed.   Constitutional:      General: She is not in acute distress.    Appearance: Normal appearance. She is well-developed. She is not diaphoretic.     Interventions: Face mask in place.     Comments: She has a cane by her side. Requires assistance on and off exam room table.   HENT:     Head: Normocephalic and atraumatic.     Comments: Short gray hair.    Mouth/Throat:     Mouth: Mucous membranes are moist. No oral lesions.     Pharynx: Oropharynx is clear. No oropharyngeal exudate.  Eyes:     General: No scleral icterus.       Right eye: No discharge.        Left eye: No discharge.     Extraocular Movements: Extraocular movements intact.     Conjunctiva/sclera: Conjunctivae normal.  Pupils: Pupils are equal, round, and reactive to light.     Comments: Blue eyes.  Neck:     Vascular: No JVD.  Cardiovascular:     Rate and Rhythm: Normal rate and regular rhythm.     Heart sounds: Normal heart sounds. No murmur heard.  No friction rub. No gallop.   Pulmonary:     Effort: Pulmonary effort is normal. No respiratory distress.     Breath sounds: Normal breath sounds. No wheezing or rales.  Chest:     Chest wall: No tenderness.  Abdominal:     General: Bowel sounds are normal. There is no distension.     Palpations: Abdomen is soft. There is no hepatomegaly, splenomegaly or mass.     Tenderness: There is no abdominal tenderness. There is no guarding or rebound.  Musculoskeletal:        General: No swelling or tenderness. Normal range of motion.     Cervical back: Normal range of motion and neck supple.  Lymphadenopathy:     Head:     Right side of head: No preauricular, posterior auricular or occipital adenopathy.     Left side of head: No preauricular, posterior auricular or occipital adenopathy.     Cervical: No cervical adenopathy.     Upper Body:     Right upper body: No supraclavicular or axillary adenopathy.     Left upper body: No supraclavicular or axillary adenopathy.      Lower Body: No right inguinal adenopathy. No left inguinal adenopathy.  Skin:    General: Skin is warm and dry.  Neurological:     Mental Status: She is alert and oriented to person, place, and time.     Deep Tendon Reflexes: Reflexes are normal and symmetric.  Psychiatric:        Behavior: Behavior normal.        Thought Content: Thought content normal.        Judgment: Judgment normal.    Appointment on 04/30/2020  Component Date Value Ref Range Status  . Uric Acid, Serum 04/30/2020 6.5  2.5 - 7.1 mg/dL Final   Performed at [facility], [address], [place], [place] [address]  . LDH 04/30/2020 141  98 - 192 U/L Final   Performed at [facility], [address], [place], [place] [address]  . Sodium 04/30/2020 128* 135 - 145 mmol/L Final  . Potassium 04/30/2020 4.1  3.5 - 5.1 mmol/L Final  . Chloride 04/30/2020 92* 98 - 111 mmol/L Final  . CO2 04/30/2020 25  22 - 32 mmol/L Final  . Glucose, Bld 04/30/2020 120* 70 - 99 mg/dL Final   Glucose reference range applies only to samples taken after fasting for at least 8 hours.  . BUN 04/30/2020 33* 8 - 23 mg/dL Final  . Creatinine, Ser 04/30/2020 1.04* 0.44 - 1.00 mg/dL Final  . Calcium 04/30/2020 10.0  8.9 - 10.3 mg/dL Final  . Total Protein 04/30/2020 7.4  6.5 - 8.1 g/dL Final  . Albumin 04/30/2020 4.3  3.5 - 5.0 g/dL Final  . AST 04/30/2020 32  15 - 41 U/L Final  . ALT 04/30/2020 23  0 - 44 U/L Final  . Alkaline Phosphatase 04/30/2020 43  38 - 126 U/L Final  . Total Bilirubin 04/30/2020 0.6  0.3 - 1.2 mg/dL Final  . GFR calc non Af Amer 04/30/2020 47* >60 mL/min Final  . GFR calc Af Amer 04/30/2020 54* >60 mL/min Final  .  Anion gap 04/30/2020 11  5 - 15 Final   Performed at [facility], [address] [place], [place] [address]  . WBC 04/30/2020 17.4* 4.0 - 10.5 K/uL Final  . RBC 04/30/2020 3.87  3.87 - 5.11 MIL/uL Final  . Hemoglobin 04/30/2020 11.8* 12.0 - 15.0 g/dL Final  . HCT  04/30/2020 34.5* 36 - 46 % Final  . MCV 04/30/2020 89.1  80.0 - 100.0 fL Final  . MCH 04/30/2020 30.5  26.0 - 34.0 pg Final  . MCHC 04/30/2020 34.2  30.0 - 36.0 g/dL Final  . RDW 04/30/2020 12.8  11.5 - 15.5 % Final  . Platelets 04/30/2020 174  150 - 400 K/uL Final  . nRBC 04/30/2020 0.0  0.0 - 0.2 % Final  . Neutrophils Relative % 04/30/2020 32  % Final  . Neutro Abs 04/30/2020 5.6  1.7 - 7.7 K/uL Final  . Lymphocytes Relative 04/30/2020 62  % Final  . Lymphs Abs 04/30/2020 10.7* 0.7 - 4.0 K/uL Final  . Monocytes Relative 04/30/2020 5  % Final  . Monocytes Absolute 04/30/2020 0.9  0 - 1 K/uL Final  . Eosinophils Relative 04/30/2020 1  % Final  . Eosinophils Absolute 04/30/2020 0.2  0 - 0 K/uL Final  . Basophils Relative 04/30/2020 0  % Final  . Basophils Absolute 04/30/2020 0.1  0 - 0 K/uL Final  . Immature Granulocytes 04/30/2020 0  % Final  . Abs Immature Granulocytes 04/30/2020 0.05  0.00 - 0.07 K/uL Final   Performed at [facility], [address], [place], [place] [address]    Assessment:  Emily Chaney is a 86 y.o. female with chronic lymphocytic leukemia (CLL)since 07/21/2018. WBC has ranged between 13,500 - 22,100. She has had lymphocytosis. She has had mild anemia (hemoglobin 11.6-11.8) and normal platelet count.  Work-up on 02/28/2019 revealeda hematocrit of 36.1, hemoglobin 12.4, MCV 89.8, platelets 197,000, white count 16,300 with an ANC of 6700.  Absolute lymphocyte count was 8400.  Reticulocyte count was 1.2%.  Normal studies included:  B12, folate, ferritin (44), iron saturation, and sed rate.    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  Findings were consistent with a CD5+ B-cell lymphoproliferative disorder but phenotype was not completely typical for chronic lymphocytic leukemia (CLL)/small lymphocytic lymphoma (SLL) or mantle cell lymphoma (MCL).  Phenotype favored atypical CLL.  FISH  revealed 85% of nuclei positive for trisomy 12. Negative studies include CCND1/IGH, ATM, 13 q and P53. Studies are consistent with CLL.  She denies recurrent infections. She denies oral or injected steroids. She does not smoke.  She received the [place] COVID-19 vaccine on 10/01/2019 and 10/24/2019.  Symptomatically, she feels "ok".  She notes fatigue.  She denies any fevers, sweats or weight loss.  Exam reveals no adenopathy or hepatosplenomegaly.  Plan: 1.   Labs today:  CBC with diff, CMP, ferritin, iron studies, B12, folate. 2. Chronic lymphocytic leukenmia Hematocrit 34.5, hemoglobin 11.8, MCV 89.1, platelets 174,000, WBC 17,400 (ANC 5600, ALC 10,700).             LDH is 141 (normal).  She describes fatigue likely unrelated to CLL.             Exam reveals no adenopathy or hepatosplenomegaly.               Consider CT scans if fatigue persists without explanation. 3. Mild normocytic anemia  Hematocrit 34.5, hemoglobin 11.8 and MCV 89.1.             Ferritin 44 with an iron saturation of 18% and a TIBC of 400.  B12 980 and folate 62 (both normal) today.             Continue to monitor. 4.   Fatigue  Etiology remains unclear.  TSH was 5.253 (0.35-4.50) with a free T4 of 1.44 (0.61-1.12) on 02/06/2020.  [facility] labs on 02/13/2020 revealed a TSH of 4.58 and a free T4 of 0.93 (normal).  Unclear if related to sodium. 5.   Hyponatremia  Sodium 128.  Etiology likely secondary to chlorthalidone.  Send labs to Dr. Janene Harvey. 6.   RTC in 6 months for MD assessment and labs (CBC with diff, CMP).  I discussed the assessment and treatment plan with the patient.  The patient was provided an opportunity to ask questions and all were answered.  The patient agreed with the plan and demonstrated an understanding of the instructions.  The patient was advised to call back if the symptoms worsen or if the condition fails to improve as anticipated.  I provided 14 minutes of  face-to-face time during this this encounter and > 50% was spent counseling as documented under my assessment and plan.  An additional 5 minutes were spent reviewing her chart (Epic and Care Everywhere) including notes, labs, and imaging studies.    Lequita Asal, MD, PhD    04/30/2020, 2:18 PM  I, Mirian Mo Tufford, am acting as a scribe for Lequita Asal, MD, PhD.   I, Drue Second. Mike Gip, MD, have reviewed the above documentation for accuracy and completeness, and I agree with the above.

## 2020-04-30 ENCOUNTER — Inpatient Hospital Stay: Payer: Medicare Other | Attending: Hematology and Oncology

## 2020-04-30 ENCOUNTER — Other Ambulatory Visit: Payer: Self-pay | Admitting: Hematology and Oncology

## 2020-04-30 ENCOUNTER — Other Ambulatory Visit: Payer: Self-pay

## 2020-04-30 ENCOUNTER — Encounter: Payer: Self-pay | Admitting: Hematology and Oncology

## 2020-04-30 ENCOUNTER — Ambulatory Visit: Payer: Medicare Other | Admitting: Hematology and Oncology

## 2020-04-30 ENCOUNTER — Other Ambulatory Visit: Payer: Medicare Other

## 2020-04-30 ENCOUNTER — Inpatient Hospital Stay (HOSPITAL_BASED_OUTPATIENT_CLINIC_OR_DEPARTMENT_OTHER): Payer: Medicare Other | Admitting: Hematology and Oncology

## 2020-04-30 VITALS — BP 171/81 | HR 65 | Temp 97.8°F | Resp 18 | Ht 60.0 in | Wt 108.9 lb

## 2020-04-30 DIAGNOSIS — N390 Urinary tract infection, site not specified: Secondary | ICD-10-CM | POA: Insufficient documentation

## 2020-04-30 DIAGNOSIS — D649 Anemia, unspecified: Secondary | ICD-10-CM | POA: Insufficient documentation

## 2020-04-30 DIAGNOSIS — E871 Hypo-osmolality and hyponatremia: Secondary | ICD-10-CM | POA: Diagnosis not present

## 2020-04-30 DIAGNOSIS — R5383 Other fatigue: Secondary | ICD-10-CM

## 2020-04-30 DIAGNOSIS — R7989 Other specified abnormal findings of blood chemistry: Secondary | ICD-10-CM

## 2020-04-30 DIAGNOSIS — C9110 Chronic lymphocytic leukemia of B-cell type not having achieved remission: Secondary | ICD-10-CM

## 2020-04-30 LAB — CBC WITH DIFFERENTIAL/PLATELET
Abs Immature Granulocytes: 0.05 10*3/uL (ref 0.00–0.07)
Basophils Absolute: 0.1 10*3/uL (ref 0.0–0.1)
Basophils Relative: 0 %
Eosinophils Absolute: 0.2 10*3/uL (ref 0.0–0.5)
Eosinophils Relative: 1 %
HCT: 34.5 % — ABNORMAL LOW (ref 36.0–46.0)
Hemoglobin: 11.8 g/dL — ABNORMAL LOW (ref 12.0–15.0)
Immature Granulocytes: 0 %
Lymphocytes Relative: 62 %
Lymphs Abs: 10.7 10*3/uL — ABNORMAL HIGH (ref 0.7–4.0)
MCH: 30.5 pg (ref 26.0–34.0)
MCHC: 34.2 g/dL (ref 30.0–36.0)
MCV: 89.1 fL (ref 80.0–100.0)
Monocytes Absolute: 0.9 10*3/uL (ref 0.1–1.0)
Monocytes Relative: 5 %
Neutro Abs: 5.6 10*3/uL (ref 1.7–7.7)
Neutrophils Relative %: 32 %
Platelets: 174 10*3/uL (ref 150–400)
RBC: 3.87 MIL/uL (ref 3.87–5.11)
RDW: 12.8 % (ref 11.5–15.5)
WBC: 17.4 10*3/uL — ABNORMAL HIGH (ref 4.0–10.5)
nRBC: 0 % (ref 0.0–0.2)

## 2020-04-30 LAB — COMPREHENSIVE METABOLIC PANEL
ALT: 23 U/L (ref 0–44)
AST: 32 U/L (ref 15–41)
Albumin: 4.3 g/dL (ref 3.5–5.0)
Alkaline Phosphatase: 43 U/L (ref 38–126)
Anion gap: 11 (ref 5–15)
BUN: 33 mg/dL — ABNORMAL HIGH (ref 8–23)
CO2: 25 mmol/L (ref 22–32)
Calcium: 10 mg/dL (ref 8.9–10.3)
Chloride: 92 mmol/L — ABNORMAL LOW (ref 98–111)
Creatinine, Ser: 1.04 mg/dL — ABNORMAL HIGH (ref 0.44–1.00)
GFR calc Af Amer: 54 mL/min — ABNORMAL LOW (ref >60)
GFR calc non Af Amer: 47 mL/min — ABNORMAL LOW (ref >60)
Glucose, Bld: 120 mg/dL — ABNORMAL HIGH (ref 70–99)
Potassium: 4.1 mmol/L (ref 3.5–5.1)
Sodium: 128 mmol/L — ABNORMAL LOW (ref 135–145)
Total Bilirubin: 0.6 mg/dL (ref 0.3–1.2)
Total Protein: 7.4 g/dL (ref 6.5–8.1)

## 2020-04-30 LAB — LACTATE DEHYDROGENASE: LDH: 141 U/L (ref 98–192)

## 2020-04-30 LAB — URIC ACID: Uric Acid, Serum: 6.5 mg/dL (ref 2.5–7.1)

## 2020-04-30 NOTE — Progress Notes (Signed)
No new changes noted today 

## 2020-05-01 ENCOUNTER — Telehealth: Payer: Self-pay

## 2020-05-01 LAB — VITAMIN B12: Vitamin B-12: 980 pg/mL — ABNORMAL HIGH (ref 180–914)

## 2020-05-01 LAB — FERRITIN: Ferritin: 44 ng/mL (ref 11–307)

## 2020-05-01 LAB — IRON AND TIBC
Iron: 73 ug/dL (ref 28–170)
Saturation Ratios: 18 % (ref 10.4–31.8)
TIBC: 400 ug/dL (ref 250–450)
UIBC: 327 ug/dL

## 2020-05-01 LAB — FOLATE: Folate: 62 ng/mL (ref >5.9)

## 2020-05-01 NOTE — Telephone Encounter (Signed)
Routed labs to the patient PCP office.

## 2020-05-11 DIAGNOSIS — E871 Hypo-osmolality and hyponatremia: Secondary | ICD-10-CM | POA: Insufficient documentation

## 2020-06-19 ENCOUNTER — Telehealth: Payer: Self-pay

## 2020-06-19 NOTE — Telephone Encounter (Signed)
Volunteer check in call made for palliative care: No answer 

## 2020-07-10 ENCOUNTER — Telehealth: Payer: Self-pay

## 2020-07-10 NOTE — Telephone Encounter (Signed)
Palliative care volunteer call made to check in on patient. No answer 

## 2020-10-27 NOTE — Progress Notes (Signed)
[facility]  [address], [address] [place], [place] [address] Phone: [phone]  Fax: [phone]   Clinic Day:  10/28/2020  Referring physician: Barbaraann Boys, MD  Chief Complaint: Emily Chaney is a 87 y.o. female with chronic lymphocytic leukemia (CLL) who is seen for 6 month assessment.  HPI: The patient was last seen in the hematology clinic on 04/30/2020. At that time, she felt "ok".  She noted fatigue.  She denied any fevers, sweats or weight loss.  Exam revealed no adenopathy or hepatosplenomegaly. Hematocrit was 34.5, hemoglobin 11.8, MCV 89.1, platelets 174,000, WBC 17,400. Sodium was 128. BUN was 33 and creatinine 1.04 (CrCl 47 ml/min). Ferritin was 44 with an iron saturation of 18% and a TIBC of 400. Uric acid was 6.5. LDH was 141. Vitamin B12 was 980 and folate 62.0.  During the interim, she has been "ok." She feels the same as she did at her last appointment. Her appetite is good. She occasional has a day or two where she gets fatigued. She does not feel that her fatigue is related to her insomnia. Her sciatica is stable. She takes Tylenol if she needs it. She denies recent infections, orthopnea or lumps or bumps.  There are no stairs at her home.  Past Medical History:  Diagnosis Date  . Coronary artery disease   . Hypertension   . Osteoporosis   . Sciatica   . Shoulder injury, right, initial encounter     Past Surgical History:  Procedure Laterality Date  . CATARACT EXTRACTION Bilateral    x 2   . cervical spinal fusion    . STENT PLACEMENT VASCULAR ([facility] HX)      Family History  Problem Relation Age of Onset  . Cancer Mother     Social History:  reports that she has never smoked. She has never used smokeless tobacco. She reports that she does not drink alcohol and does not use drugs.  She denies exposure to radiation and toxins. She is originally from [place], and lived in [place] for 22 years. [place]ives in [place].  The patient is accompanied by her daughter today.  Allergies:  Allergies  Allergen Reactions  . Amlodipine Rash  . Alendronate Other (See Comments)    Swallowing difficulty Swallowing difficulty   . Hydralazine Other (See Comments)    Other reaction(s): Dizziness lightheaded lightheaded   . Sulfa Antibiotics Other (See Comments)    Other Reaction: OTHER REACTION: NUMBNESS     Current Medications: Current Outpatient Medications  Medication Sig Dispense Refill  . acetaminophen (TYLENOL) 325 MG tablet Take 325 mg by mouth every 6 (six) hours as needed.     Marland Kitchen aspirin EC 81 MG tablet Take 81 mg by mouth daily.     Marland Kitchen atorvastatin (LIPITOR) 40 MG tablet Take 40 mg by mouth daily at 6 PM.     . busPIRone (BUSPAR) 5 MG tablet Take 5 mg by mouth daily.     . Calcium 200 MG TABS Take 400 mg by mouth 2 (two) times a day.    . chlorthalidone (HYGROTON) 25 MG tablet Take by mouth. 1 whole tab in the morning( 25 mg) and 1/2 tab in the afternoon ( 12.5)    . lisinopril (ZESTRIL) 20 MG tablet Take 20 mg by mouth 2 (two) times a day.     . Multiple Vitamins-Minerals (MULTIVITAMIN ADULT PO) Take 1 tablet by mouth daily.     . carvedilol (COREG) 12.5 MG tablet Take by mouth  2 (two) times daily with a meal. 2 Tablets AM, 1 Tablet QHS     No current facility-administered medications for this visit.    Review of Systems  Constitutional: Positive for malaise/fatigue (exhausted, low energy). Negative for chills, diaphoresis, fever and weight loss (stable).  HENT: Positive for hearing loss (hearing aids). Negative for congestion, ear discharge, ear pain, nosebleeds, sinus pain, sore throat and tinnitus.   Eyes: Negative.  Negative for blurred vision and double vision.  Respiratory: Negative.  Negative for cough, hemoptysis, sputum production, shortness of breath and stridor.   Cardiovascular: Negative.  Negative for chest pain, palpitations and leg swelling.  Gastrointestinal: Negative.  Negative for  abdominal pain, blood in stool, constipation, diarrhea, heartburn, melena, nausea and vomiting.  Genitourinary: Negative for dysuria, frequency, hematuria and urgency.  Musculoskeletal: Negative.  Negative for back pain, joint pain, myalgias and neck pain.  Skin: Negative.  Negative for itching and rash.  Neurological: Negative for dizziness, tingling, sensory change, speech change, focal weakness, weakness and headaches.       Sciatica, chronic.  Endo/Heme/Allergies: Negative.  Negative for environmental allergies. Does not bruise/bleed easily.  Psychiatric/Behavioral: Negative for depression and memory loss. The patient has insomnia (on and off, stable). The patient is not nervous/anxious.   All other systems reviewed and are negative.  Performance status (ECOG): 1  Vitals Blood pressure (!) 159/52, pulse (!) 57, resp. rate 18, weight 108 lb 0.4 oz (49 kg), SpO2 100 %.   Physical Exam Vitals and nursing note reviewed.  Constitutional:      General: She is not in acute distress.    Appearance: Normal appearance. She is well-developed. She is not diaphoretic.     Interventions: Face mask in place.     Comments: She has a cane by her side. Requires assistance on and off exam room table.   HENT:     Head: Normocephalic and atraumatic.     Comments: Short gray hair.    Mouth/Throat:     Mouth: Mucous membranes are moist. No oral lesions.     Pharynx: Oropharynx is clear. No oropharyngeal exudate.  Eyes:     General: No scleral icterus.       Right eye: No discharge.        Left eye: No discharge.     Extraocular Movements: Extraocular movements intact.     Conjunctiva/sclera: Conjunctivae normal.     Pupils: Pupils are equal, round, and reactive to light.     Comments: Blue eyes.  Neck:     Vascular: No JVD.  Cardiovascular:     Rate and Rhythm: Normal rate and regular rhythm.     Heart sounds: Normal heart sounds. No murmur heard. No friction rub. No gallop.   Pulmonary:      Effort: Pulmonary effort is normal. No respiratory distress.     Breath sounds: Normal breath sounds. No wheezing or rales.  Chest:     Chest wall: No tenderness.  Breasts:     Right: No axillary adenopathy or supraclavicular adenopathy.     Left: No axillary adenopathy or supraclavicular adenopathy.    Abdominal:     General: Bowel sounds are normal. There is no distension.     Palpations: Abdomen is soft. There is no hepatomegaly, splenomegaly or mass.     Tenderness: There is no abdominal tenderness. There is no guarding or rebound.  Musculoskeletal:        General: No swelling or tenderness. Normal range of motion.  Cervical back: Normal range of motion and neck supple.  Lymphadenopathy:     Head:     Right side of head: No preauricular, posterior auricular or occipital adenopathy.     Left side of head: No preauricular, posterior auricular or occipital adenopathy.     Cervical: No cervical adenopathy.     Upper Body:     Right upper body: No supraclavicular or axillary adenopathy.     Left upper body: No supraclavicular or axillary adenopathy.     Lower Body: No right inguinal adenopathy. No left inguinal adenopathy.  Skin:    General: Skin is warm and dry.  Neurological:     Mental Status: She is alert and oriented to person, place, and time.     Deep Tendon Reflexes: Reflexes are normal and symmetric.  Psychiatric:        Behavior: Behavior normal.        Thought Content: Thought content normal.        Judgment: Judgment normal.    Appointment on 10/28/2020  Component Date Value Ref Range Status  . Sodium 10/28/2020 133* 135 - 145 mmol/L Final  . Potassium 10/28/2020 3.9  3.5 - 5.1 mmol/L Final  . Chloride 10/28/2020 95* 98 - 111 mmol/L Final  . CO2 10/28/2020 29  22 - 32 mmol/L Final  . Glucose, Bld 10/28/2020 99  70 - 99 mg/dL Final   Glucose reference range applies only to samples taken after fasting for at least 8 hours.  . BUN 10/28/2020 46* 8 - 23 mg/dL  Final  . Creatinine, Ser 10/28/2020 1.20* 0.44 - 1.00 mg/dL Final  . Calcium 10/28/2020 10.0  8.9 - 10.3 mg/dL Final  . Total Protein 10/28/2020 7.2  6.5 - 8.1 g/dL Final  . Albumin 10/28/2020 4.0  3.5 - 5.0 g/dL Final  . AST 10/28/2020 36  15 - 41 U/L Final  . ALT 10/28/2020 22  0 - 44 U/L Final  . Alkaline Phosphatase 10/28/2020 53  38 - 126 U/L Final  . Total Bilirubin 10/28/2020 0.9  0.3 - 1.2 mg/dL Final  . GFR, Estimated 10/28/2020 42* >60 mL/min Final   Comment: (NOTE) Calculated using the CKD-EPI Creatinine Equation (2021)   . Anion gap 10/28/2020 9  5 - 15 Final   Performed at [facility], [address], [place], [place] [address]  . WBC 10/28/2020 17.5* 4.0 - 10.5 K/uL Final  . RBC 10/28/2020 3.92  3.87 - 5.11 MIL/uL Final  . Hemoglobin 10/28/2020 11.6* 12.0 - 15.0 g/dL Final  . HCT 10/28/2020 35.9* 36.0 - 46.0 % Final  . MCV 10/28/2020 91.6  80.0 - 100.0 fL Final  . MCH 10/28/2020 29.6  26.0 - 34.0 pg Final  . MCHC 10/28/2020 32.3  30.0 - 36.0 g/dL Final  . RDW 10/28/2020 13.5  11.5 - 15.5 % Final  . Platelets 10/28/2020 169  150 - 400 K/uL Final  . nRBC 10/28/2020 0.0  0.0 - 0.2 % Final   Performed at [facility], [address] [place], [place] [address]  . Neutrophils Relative % 10/28/2020 PENDING  % Incomplete  . Neutro Abs 10/28/2020 PENDING  1.7 - 7.7 K/uL Incomplete  . Band Neutrophils 10/28/2020 PENDING  % Incomplete  . Lymphocytes Relative 10/28/2020 PENDING  % Incomplete  . Lymphs Abs 10/28/2020 PENDING  0.7 - 4.0 K/uL Incomplete  . Monocytes Relative 10/28/2020 PENDING  % Incomplete  . Monocytes Absolute 10/28/2020 PENDING  0.1 - 1.0 K/uL  Incomplete  . Eosinophils Relative 10/28/2020 PENDING  % Incomplete  . Eosinophils Absolute 10/28/2020 PENDING  0.0 - 0.5 K/uL Incomplete  . Basophils Relative 10/28/2020 PENDING  % Incomplete  . Basophils Absolute 10/28/2020 PENDING  0.0 - 0.1 K/uL Incomplete  . WBC Morphology 10/28/2020  PENDING   Incomplete  . RBC Morphology 10/28/2020 PENDING   Incomplete  . Smear Review 10/28/2020 PENDING   Incomplete  . Other 10/28/2020 PENDING  % Incomplete  . nRBC 10/28/2020 PENDING  0 /100 WBC Incomplete  . Metamyelocytes Relative 10/28/2020 PENDING  % Incomplete  . Myelocytes 10/28/2020 PENDING  % Incomplete  . Promyelocytes Relative 10/28/2020 PENDING  % Incomplete  . Blasts 10/28/2020 PENDING  % Incomplete  . Immature Granulocytes 10/28/2020 PENDING  % Incomplete  . Abs Immature Granulocytes 10/28/2020 PENDING  0.00 - 0.07 K/uL Incomplete    Assessment:  Emily Chaney is a 87 y.o. female with chronic lymphocytic leukemia (CLL)since 07/21/2018. WBC has ranged between 13,500 - 22,100. She has had lymphocytosis. She has had mild anemia (hemoglobin 11.6-11.8) and normal platelet count.  Work-up on 02/28/2019 revealeda hematocrit of 36.1, hemoglobin 12.4, MCV 89.8, platelets 197,000, white count 16,300 with an ANC of 6700.  Absolute lymphocyte count was 8400.  Reticulocyte count was 1.2%.  Normal studies included:  B12, folate, ferritin (44), iron saturation, and sed rate.    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  Findings were consistent with a CD5+ B-cell lymphoproliferative disorder but phenotype was not completely typical for chronic lymphocytic leukemia (CLL)/small lymphocytic lymphoma (SLL) or mantle cell lymphoma (MCL).  Phenotype favored atypical CLL.  FISH revealed 85% of nuclei positive for trisomy 12. Negative studies include CCND1/IGH, ATM, 13 q and P53. Studies are consistent with CLL.  She denies recurrent infections. She denies oral or injected steroids. She does not smoke.  She received the [place] COVID-19 vaccine on 10/01/2019 and 10/24/2019.  Symptomatically, she feels "okay".  She denies any fevers, sweats or weight loss.  Exam reveals no adenopathy or hepatosplenomegaly.  Plan: 1.   Labs  today: CBC with diff, CMP. 2. Chronic lymphocytic leukemia Hematocrit 35.9, hemoglobin 11.6, MCV 91.6, platelets 169,000, WBC 17,500 (ANC 5200, ALC 1900).  She has fatigue felt unlikely related to CLL.             Exam reveals no adenopathy or hepatosplenomegaly.               Consider CT scans if fatigue persists without explanation. 3. Mild normocytic anemia             Hematocrit 35.9, hemoglobin 11.6 and MCV 91.6.             Ferritin 44 with an iron saturation of 18% and a TIBC of 400 on 04/30/2020.  B12 980 and folate 62 (both normal) 04/30/2020.             Continue to monitor. 4.   Fatigue  Etiology remains unclear.  TSH was 5.253 (0.35-4.50) with a free T4 of 1.44 (0.61-1.12) on 02/06/2020.  [facility] labs on 02/13/2020 revealed a TSH of 4.58 and a free T4 of 0.93 (normal).  Unclear if related to sodium although sodium better today. 5.   Hyponatremia  Sodium 133.  Etiology likely secondary to chlorthalidone. 6.   RTC in 6 months for MD assess and labs (CBC with diff, CMP).  I discussed the assessment and treatment plan with the patient.  The patient was  provided an opportunity to ask questions and all were answered.  The patient agreed with the plan and demonstrated an understanding of the instructions.  The patient was advised to call back if the symptoms worsen or if the condition fails to improve as anticipated.   Lequita Asal, MD, PhD    10/28/2020, 1:59 PM  I, Evert Kohl, am acting as a scribe for Lequita Asal, MD, PhD.   I, Drue Second. Mike Gip, MD, have reviewed the above documentation for accuracy and completeness, and I agree with the above.

## 2020-10-28 ENCOUNTER — Other Ambulatory Visit: Payer: Self-pay

## 2020-10-28 ENCOUNTER — Inpatient Hospital Stay: Payer: Medicare Other | Attending: Hematology and Oncology | Admitting: Hematology and Oncology

## 2020-10-28 ENCOUNTER — Encounter: Payer: Self-pay | Admitting: Hematology and Oncology

## 2020-10-28 ENCOUNTER — Inpatient Hospital Stay: Payer: Medicare Other

## 2020-10-28 VITALS — BP 159/52 | HR 57 | Resp 18 | Wt 108.0 lb

## 2020-10-28 DIAGNOSIS — R5383 Other fatigue: Secondary | ICD-10-CM | POA: Diagnosis not present

## 2020-10-28 DIAGNOSIS — E871 Hypo-osmolality and hyponatremia: Secondary | ICD-10-CM | POA: Insufficient documentation

## 2020-10-28 DIAGNOSIS — C9110 Chronic lymphocytic leukemia of B-cell type not having achieved remission: Secondary | ICD-10-CM | POA: Insufficient documentation

## 2020-10-28 DIAGNOSIS — D649 Anemia, unspecified: Secondary | ICD-10-CM

## 2020-10-28 LAB — CBC WITH DIFFERENTIAL/PLATELET
Abs Immature Granulocytes: 0.05 10*3/uL (ref 0.00–0.07)
Basophils Absolute: 0 10*3/uL (ref 0.0–0.1)
Basophils Relative: 0 %
Eosinophils Absolute: 0.2 10*3/uL (ref 0.0–0.5)
Eosinophils Relative: 1 %
HCT: 35.9 % — ABNORMAL LOW (ref 36.0–46.0)
Hemoglobin: 11.6 g/dL — ABNORMAL LOW (ref 12.0–15.0)
Immature Granulocytes: 0 %
Lymphocytes Relative: 63 %
Lymphs Abs: 10.9 10*3/uL — ABNORMAL HIGH (ref 0.7–4.0)
MCH: 29.6 pg (ref 26.0–34.0)
MCHC: 32.3 g/dL (ref 30.0–36.0)
MCV: 91.6 fL (ref 80.0–100.0)
Monocytes Absolute: 1.1 10*3/uL — ABNORMAL HIGH (ref 0.1–1.0)
Monocytes Relative: 6 %
Neutro Abs: 5.2 10*3/uL (ref 1.7–7.7)
Neutrophils Relative %: 30 %
Platelets: 169 10*3/uL (ref 150–400)
RBC: 3.92 MIL/uL (ref 3.87–5.11)
RDW: 13.5 % (ref 11.5–15.5)
Smear Review: NORMAL
WBC: 17.5 10*3/uL — ABNORMAL HIGH (ref 4.0–10.5)
nRBC: 0 % (ref 0.0–0.2)

## 2020-10-28 LAB — COMPREHENSIVE METABOLIC PANEL
ALT: 22 U/L (ref 0–44)
AST: 36 U/L (ref 15–41)
Albumin: 4 g/dL (ref 3.5–5.0)
Alkaline Phosphatase: 53 U/L (ref 38–126)
Anion gap: 9 (ref 5–15)
BUN: 46 mg/dL — ABNORMAL HIGH (ref 8–23)
CO2: 29 mmol/L (ref 22–32)
Calcium: 10 mg/dL (ref 8.9–10.3)
Chloride: 95 mmol/L — ABNORMAL LOW (ref 98–111)
Creatinine, Ser: 1.2 mg/dL — ABNORMAL HIGH (ref 0.44–1.00)
GFR, Estimated: 42 mL/min — ABNORMAL LOW (ref >60)
Glucose, Bld: 99 mg/dL (ref 70–99)
Potassium: 3.9 mmol/L (ref 3.5–5.1)
Sodium: 133 mmol/L — ABNORMAL LOW (ref 135–145)
Total Bilirubin: 0.9 mg/dL (ref 0.3–1.2)
Total Protein: 7.2 g/dL (ref 6.5–8.1)

## 2021-04-26 ENCOUNTER — Other Ambulatory Visit: Payer: Self-pay | Admitting: *Deleted

## 2021-04-26 DIAGNOSIS — C9110 Chronic lymphocytic leukemia of B-cell type not having achieved remission: Secondary | ICD-10-CM

## 2021-04-29 ENCOUNTER — Inpatient Hospital Stay (HOSPITAL_BASED_OUTPATIENT_CLINIC_OR_DEPARTMENT_OTHER): Payer: Medicare Other | Admitting: Oncology

## 2021-04-29 ENCOUNTER — Other Ambulatory Visit: Payer: Self-pay | Admitting: *Deleted

## 2021-04-29 ENCOUNTER — Inpatient Hospital Stay: Payer: Medicare Other | Attending: Oncology | Admitting: Oncology

## 2021-04-29 ENCOUNTER — Encounter: Payer: Self-pay | Admitting: Oncology

## 2021-04-29 ENCOUNTER — Other Ambulatory Visit: Payer: Self-pay

## 2021-04-29 VITALS — BP 163/62 | HR 62 | Temp 97.9°F | Resp 14 | Wt 108.6 lb

## 2021-04-29 DIAGNOSIS — M7989 Other specified soft tissue disorders: Secondary | ICD-10-CM | POA: Insufficient documentation

## 2021-04-29 DIAGNOSIS — D649 Anemia, unspecified: Secondary | ICD-10-CM | POA: Diagnosis not present

## 2021-04-29 DIAGNOSIS — R5383 Other fatigue: Secondary | ICD-10-CM | POA: Diagnosis not present

## 2021-04-29 DIAGNOSIS — C9110 Chronic lymphocytic leukemia of B-cell type not having achieved remission: Secondary | ICD-10-CM

## 2021-04-29 DIAGNOSIS — D539 Nutritional anemia, unspecified: Secondary | ICD-10-CM

## 2021-04-29 DIAGNOSIS — E871 Hypo-osmolality and hyponatremia: Secondary | ICD-10-CM | POA: Diagnosis not present

## 2021-04-29 LAB — IRON AND TIBC
Iron: 75 ug/dL (ref 28–170)
Saturation Ratios: 20 % (ref 10.4–31.8)
TIBC: 367 ug/dL (ref 250–450)
UIBC: 292 ug/dL

## 2021-04-29 LAB — CBC WITH DIFFERENTIAL/PLATELET
Abs Immature Granulocytes: 0.06 10*3/uL (ref 0.00–0.07)
Basophils Absolute: 0 10*3/uL (ref 0.0–0.1)
Basophils Relative: 0 %
Eosinophils Absolute: 0.2 10*3/uL (ref 0.0–0.5)
Eosinophils Relative: 1 %
HCT: 33.2 % — ABNORMAL LOW (ref 36.0–46.0)
Hemoglobin: 11.7 g/dL — ABNORMAL LOW (ref 12.0–15.0)
Immature Granulocytes: 0 %
Lymphocytes Relative: 64 %
Lymphs Abs: 13.4 10*3/uL — ABNORMAL HIGH (ref 0.7–4.0)
MCH: 31.1 pg (ref 26.0–34.0)
MCHC: 35.2 g/dL (ref 30.0–36.0)
MCV: 88.3 fL (ref 80.0–100.0)
Monocytes Absolute: 1.1 10*3/uL — ABNORMAL HIGH (ref 0.1–1.0)
Monocytes Relative: 5 %
Neutro Abs: 6.3 10*3/uL (ref 1.7–7.7)
Neutrophils Relative %: 30 %
Platelets: 165 10*3/uL (ref 150–400)
RBC: 3.76 MIL/uL — ABNORMAL LOW (ref 3.87–5.11)
RDW: 13.4 % (ref 11.5–15.5)
Smear Review: NORMAL
WBC Morphology: ABNORMAL
WBC: 21.1 10*3/uL — ABNORMAL HIGH (ref 4.0–10.5)
nRBC: 0 % (ref 0.0–0.2)

## 2021-04-29 LAB — COMPREHENSIVE METABOLIC PANEL
ALT: 27 U/L (ref 0–44)
AST: 40 U/L (ref 15–41)
Albumin: 4.2 g/dL (ref 3.5–5.0)
Alkaline Phosphatase: 50 U/L (ref 38–126)
Anion gap: 8 (ref 5–15)
BUN: 42 mg/dL — ABNORMAL HIGH (ref 8–23)
CO2: 26 mmol/L (ref 22–32)
Calcium: 10.2 mg/dL (ref 8.9–10.3)
Chloride: 95 mmol/L — ABNORMAL LOW (ref 98–111)
Creatinine, Ser: 1.11 mg/dL — ABNORMAL HIGH (ref 0.44–1.00)
GFR, Estimated: 46 mL/min — ABNORMAL LOW (ref >60)
Glucose, Bld: 109 mg/dL — ABNORMAL HIGH (ref 70–99)
Potassium: 4.1 mmol/L (ref 3.5–5.1)
Sodium: 129 mmol/L — ABNORMAL LOW (ref 135–145)
Total Bilirubin: 0.8 mg/dL (ref 0.3–1.2)
Total Protein: 7.3 g/dL (ref 6.5–8.1)

## 2021-04-29 LAB — FERRITIN: Ferritin: 33 ng/mL (ref 11–307)

## 2021-04-29 NOTE — Progress Notes (Signed)
Lab was added on

## 2021-04-29 NOTE — Progress Notes (Signed)
[facility]  [address] [place], [place] [address] Phone: [phone]  Fax: [phone]   Clinic Day:  04/29/2021  Referring physician: Barbaraann Boys, MD  Chief Complaint: Emily Chaney is a 87 y.o. female with chronic lymphocytic leukemia (CLL) who is seen for 6 month assessment.  HPI: [name] is a 87 y.o. female with chronic lymphocytic leukemia (CLL) since 07/21/2018.  WBC has ranged between 13,500 - 22,100.  She has had lymphocytosis.  She has had mild anemia (hemoglobin 11.6-11.8) and normal platelet count.   Work-up on 02/28/2019 revealeda hematocrit of 36.1, hemoglobin 12.4, MCV 89.8, platelets 197,000, white count 16,300 with an ANC of 6700.  Absolute lymphocyte count was 8400.  Reticulocyte count was 1.2%.  Normal studies included:  B12, folate, ferritin (44), iron saturation, and sed rate.     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  Findings were consistent with a CD5+ B-cell lymphoproliferative disorder but phenotype was not completely typical for chronic lymphocytic leukemia (CLL)/small lymphocytic lymphoma (SLL) or mantle cell lymphoma (MCL).  Phenotype favored atypical CLL.  FISH revealed 85% of nuclei positive for trisomy 12.  Negative studies include CCND1/IGH, ATM, 13 q and P53.  Studies are consistent with CLL.   She denies recurrent infections.  She denies oral or injected steroids.  She does not smoke.  She received the [place] COVID-19 vaccine on 10/01/2019 and 10/24/2019.  Interval History- Emily Chaney is a 87 year old female who presents for follow-up for CLL.  She was last seen by Dr. Mike Gip about 6 months ago and was doing well.  Biggest concern was ongoing fatigue.  In the interim, she denies any infections or recent fevers.  She has noticed some increased swelling in her lower extremities that appears worse than usual; usually better in the morning.  She  is not using compression stockings because they are hard to get on.  Reports elevation at times.  She denies any new lumps or bumps.  Denies any B symptoms.  Appetite is stable and fair. Does not drink a lot of fluids.    Past Medical History:  Diagnosis Date   Coronary artery disease    Hypertension    Osteoporosis    Sciatica    Shoulder injury, right, initial encounter     Past Surgical History:  Procedure Laterality Date   CATARACT EXTRACTION Bilateral    x 2    cervical spinal fusion     STENT PLACEMENT VASCULAR ([facility] HX)      Family History  Problem Relation Age of Onset   Cancer Mother     Social History:  reports that she has never smoked. She has never used smokeless tobacco. She reports that she does not drink alcohol and does not use drugs.  She denies exposure to radiation and toxins. She is originally from [place], and lived in [place] for 22 years. She currently lives in [place]. The patient is accompanied by her daughter today.  Allergies:  Allergies  Allergen Reactions   Amlodipine Rash   Alendronate Other (See Comments)    Swallowing difficulty Swallowing difficulty    Hydralazine Other (See Comments)    Other reaction(s): Dizziness lightheaded lightheaded    Sulfa Antibiotics Other (See Comments)    Other Reaction: OTHER REACTION: NUMBNESS     Current Medications: Current Outpatient Medications  Medication Sig Dispense Refill   acetaminophen (TYLENOL) 325 MG tablet Take 325 mg by mouth every 6 (  six) hours as needed.      aspirin EC 81 MG tablet Take 81 mg by mouth daily.      atorvastatin (LIPITOR) 40 MG tablet Take 40 mg by mouth daily at 6 PM.      atorvastatin (LIPITOR) 40 MG tablet Take by mouth.     busPIRone (BUSPAR) 5 MG tablet Take 5 mg by mouth daily.      Calcium 200 MG TABS Take 400 mg by mouth 2 (two) times a day.     carvedilol (COREG) 12.5 MG tablet Take by mouth 2 (two) times daily with a meal. 2 Tablets AM, 1 Tablet QHS      chlorthalidone (HYGROTON) 25 MG tablet Take by mouth. 1 whole tab in the morning( 25 mg) and 1/2 tab in the afternoon ( 12.5)     lisinopril (ZESTRIL) 20 MG tablet Take 20 mg by mouth 2 (two) times a day.      Multiple Vitamins-Minerals (MULTIVITAMIN ADULT PO) Take 1 tablet by mouth daily.      No current facility-administered medications for this visit.    Review of Systems  Constitutional:  Positive for malaise/fatigue. Negative for chills, fever and weight loss.  HENT:  Positive for hearing loss. Negative for congestion, ear pain and tinnitus.   Eyes: Negative.  Negative for blurred vision and double vision.  Respiratory: Negative.  Negative for cough, sputum production and shortness of breath.   Cardiovascular:  Positive for leg swelling. Negative for chest pain and palpitations.  Gastrointestinal: Negative.  Negative for abdominal pain, constipation, diarrhea, nausea and vomiting.  Genitourinary:  Negative for dysuria, frequency and urgency.  Musculoskeletal:  Negative for back pain and falls.  Skin: Negative.  Negative for rash.  Neurological: Negative.  Negative for weakness and headaches.  Endo/Heme/Allergies: Negative.  Does not bruise/bleed easily.  Psychiatric/Behavioral: Negative.  Negative for depression. The patient is not nervous/anxious and does not have insomnia.   Performance status (ECOG): 1  Vitals Blood pressure (!) 163/62, pulse 62, temperature 97.9 F (36.6 C), resp. rate 14, weight 108 lb 9.2 oz (49.2 kg), SpO2 98 %.   Physical Exam Constitutional:      Appearance: Normal appearance. She is well-developed.  HENT:     Head: Normocephalic and atraumatic.  Eyes:     Pupils: Pupils are equal, round, and reactive to light.  Cardiovascular:     Rate and Rhythm: Normal rate and regular rhythm.     Heart sounds: No murmur heard. Pulmonary:     Effort: Pulmonary effort is normal.     Breath sounds: Normal breath sounds. No wheezing.  Abdominal:     General:  Bowel sounds are normal. There is no distension.     Palpations: Abdomen is soft. There is no mass.     Tenderness: There is no abdominal tenderness.  Musculoskeletal:        General: Normal range of motion.     Cervical back: Normal range of motion.  Skin:    General: Skin is warm and dry.  Neurological:     Mental Status: She is alert and oriented to person, place, and time.  Psychiatric:        Behavior: Behavior normal.   Appointment on 04/29/2021  Component Date Value Ref Range Status   WBC 04/29/2021 21.1 (A) 4.0 - 10.5 K/uL Final   RBC 04/29/2021 3.76 (A) 3.87 - 5.11 MIL/uL Final   Hemoglobin 04/29/2021 11.7 (A) 12.0 - 15.0 g/dL Final   HCT 04/29/2021  33.2 (A) 36.0 - 46.0 % Final   MCV 04/29/2021 88.3  80.0 - 100.0 fL Final   MCH 04/29/2021 31.1  26.0 - 34.0 pg Final   MCHC 04/29/2021 35.2  30.0 - 36.0 g/dL Final   RDW 04/29/2021 13.4  11.5 - 15.5 % Final   Platelets 04/29/2021 165  150 - 400 K/uL Final   nRBC 04/29/2021 0.0  0.0 - 0.2 % Final   Performed at [facility], [address], [place], [place] [address]   Neutrophils Relative % 04/29/2021 PENDING  % Incomplete   Neutro Abs 04/29/2021 PENDING  1.7 - 7.7 K/uL Incomplete   Band Neutrophils 04/29/2021 PENDING  % Incomplete   Lymphocytes Relative 04/29/2021 PENDING  % Incomplete   Lymphs Abs 04/29/2021 PENDING  0.7 - 4.0 K/uL Incomplete   Monocytes Relative 04/29/2021 PENDING  % Incomplete   Monocytes Absolute 04/29/2021 PENDING  0.1 - 1.0 K/uL Incomplete   Eosinophils Relative 04/29/2021 PENDING  % Incomplete   Eosinophils Absolute 04/29/2021 PENDING  0.0 - 0.5 K/uL Incomplete   Basophils Relative 04/29/2021 PENDING  % Incomplete   Basophils Absolute 04/29/2021 PENDING  0.0 - 0.1 K/uL Incomplete   WBC Morphology 04/29/2021 PENDING   Incomplete   RBC Morphology 04/29/2021 PENDING   Incomplete   Smear Review 04/29/2021 PENDING   Incomplete   Other 04/29/2021 PENDING  % Incomplete   nRBC 04/29/2021  PENDING  0 /100 WBC Incomplete   Metamyelocytes Relative 04/29/2021 PENDING  % Incomplete   Myelocytes 04/29/2021 PENDING  % Incomplete   Promyelocytes Relative 04/29/2021 PENDING  % Incomplete   Blasts 04/29/2021 PENDING  % Incomplete   Immature Granulocytes 04/29/2021 PENDING  % Incomplete   Abs Immature Granulocytes 04/29/2021 PENDING  0.00 - 0.07 K/uL Incomplete    Assessment: Mrs. Burdell is a 87 year old female who presents today for follow-up for CLL.  Reports doing well except for mild fatigue and bilateral lower extremity swelling.  CLL- Patient is currently not receiving treatment and under active surveillance.  She has fluctuating elevated white blood cell counts ranging from 16 to as high as 23.8 in June 2021.  Today her labs show a white blood cell count of 21.1, hemoglobin 11.7 and a platelet count of 165,000.  Absolute lymphocyte count is 13.4.  No evidence of lymphadenopathy or organomegaly. Continue surveillance for now.  RTC in 6 months with repeat labs and to see Dr. Janese Banks.  Chronic fatigue- Previously worked up including normal ferritin and iron panel, TSH and B12 levels.  This was about a year ago.  We will repeat today given slow decline in hemoglobin.  Sodium level is also low which could be contributing.  Recommend she hydrate.  We will call her with lab results.  Normocytic anemia- Given fatigue will recheck ferritin, iron panel and B12 levels. Ferritin levels are 33 which are considered normal but will prefer them to be between 50 and 100.  Patient can start oral iron tablets if she is able to tolerate 1 daily.  We will recheck at her next visit in 6 months.  Hyponatremia- Sodium level was 129.  This is lower than before.  Recommend increase salt in her everyday foods and drink plenty of water.  She is also on Chlorthalidone.   I spent 25 minutes dedicated to the care of this patient (face-to-face and non-face-to-face) on the date of the encounter to include what is  described in the assessment and plan.  Faythe Casa, NP 05/01/2021 8:15 AM

## 2021-04-30 ENCOUNTER — Ambulatory Visit: Payer: Medicare Other | Admitting: Oncology

## 2021-04-30 ENCOUNTER — Other Ambulatory Visit: Payer: Medicare Other

## 2021-05-01 ENCOUNTER — Encounter: Payer: Self-pay | Admitting: Oncology

## 2021-08-28 ENCOUNTER — Other Ambulatory Visit: Payer: Self-pay | Admitting: Nurse Practitioner

## 2021-08-28 DIAGNOSIS — U071 COVID-19: Secondary | ICD-10-CM

## 2021-08-28 MED ORDER — NIRMATRELVIR/RITONAVIR (PAXLOVID) TABLET (RENAL DOSING): 2.0000 | ORAL_TABLET | Freq: Two times a day (BID) | ORAL | 0 refills | Status: AC

## 2021-08-28 NOTE — Progress Notes (Signed)
Ccovid positive. Onset today. Paxlovid renal dosing sent to pharmacy. Reviewed with patient by phone.

## 2021-09-16 ENCOUNTER — Telehealth: Payer: Self-pay | Admitting: Primary Care

## 2021-09-16 NOTE — Telephone Encounter (Signed)
T/c from daughter requesting palliative services. Family had put services on hold several years ago.  Patient has had recent covid illness and now is in SNF, hoping to be home with help. I offered any assistance.

## 2021-09-23 ENCOUNTER — Non-Acute Institutional Stay: Payer: Medicare Other | Admitting: Primary Care

## 2021-09-23 ENCOUNTER — Other Ambulatory Visit: Payer: Self-pay

## 2021-09-23 DIAGNOSIS — R5381 Other malaise: Secondary | ICD-10-CM

## 2021-09-23 DIAGNOSIS — R5383 Other fatigue: Secondary | ICD-10-CM

## 2021-09-23 DIAGNOSIS — D479 Neoplasm of uncertain behavior of lymphoid, hematopoietic and related tissue, unspecified: Secondary | ICD-10-CM

## 2021-09-23 DIAGNOSIS — I509 Heart failure, unspecified: Secondary | ICD-10-CM

## 2021-09-23 DIAGNOSIS — I214 Non-ST elevation (NSTEMI) myocardial infarction: Secondary | ICD-10-CM

## 2021-09-23 DIAGNOSIS — U071 COVID-19: Secondary | ICD-10-CM

## 2021-09-23 DIAGNOSIS — I5020 Unspecified systolic (congestive) heart failure: Secondary | ICD-10-CM

## 2021-09-23 DIAGNOSIS — E871 Hypo-osmolality and hyponatremia: Secondary | ICD-10-CM

## 2021-09-23 NOTE — Progress Notes (Addendum)
[facility] Pall[facility] Care Consult Note Telephone: [phone]  Fax: [phone]   Date of encounter: 09/23/21 6:49 PM PATIENT NAME: Emily Chaney [place] [address]   [phone] (home)  DOB: 02-May-1925 MRN: [id number] PRIMARY CARE PROVIDER:    Barbaraann Boys, MD,  [place] [address] [phone]  REFERRING PROVIDER:   Adria Dill, NP    RESPONSIBLE PARTY:    Contact Information     Name Relation Home Work Mobile   Dale Emily Chaney [phone]          I met face to face with patient and family in [facility] facility. Palliative Care was asked to follow this patient by consultation request of Melody Ordinario, NP    to address advance care planning and complex medical decision making. This is the initial visit.                                     ASSESSMENT AND PLAN / RECOMMENDATIONS:   Advance Care Planning/Goals of Care: Goals include to maximize quality of life and symptom management. Patient/health care surrogate gave his/her permission to discuss.Our advance care planning conversation included a discussion about:    The value and importance of advance care planning  Exploration of personal, cultural or spiritual beliefs that might influence medical decisions  Exploration of goals of care in the event of a sudden injury or illness  Identification of a healthcare agent - Emily Chaney Blanch Media Met with family and patient to discuss goals for independence and QoL. Review of an  advance directive document - Has DNR, defers most CODE STATUS: MOST   Symptom Management/Plan:  Indigestion: Recommend PPI. Endorses GERD and feeling of not passing food.  Also I Recommend speech evaluation while she's in SNF to inform if she needs at home.  Mobility: Wants to go home to (I) .  Discussed her fatigue and difficulty ambulating and changing positions due to edema and debility. Discussed with Emily Chaney that  she will Need caregivers at home. Need transfer chair. Asked Emily Chaney to  SNF to obtain or I will f/u once she is home ( or home health agency ) for transport chair.   D/c planning: Needs to have help when she goes home, discussed area agencies for home health and for care provision.  Edema: Has 3-4+ pitting in upper thighs due to compression wraps on bil LE. No previous EF data to compare to recent 35% value. I recommend she f/u with her cardiology that she's already known to and see him for continuity of care for when she d/c back to community. She has new edema and recent covid infection with debility. Reviewed recent labs, had been hyponatremic early in hospital but was wnl on d/c on 09/13/21.   Follow up Palliative Care Visit: Palliative care will continue to follow for complex medical decision making, advance care planning, and clarification of goals. Return 4 weeks or prn.  This visit was coded based on medical decision making (MDM).  PPS: 40% weak   HOSPICE ELIGIBILITY/DIAGNOSIS: TBD  Chief Complaint: debility following covid infection, LE edema, HFrEF, "covid" Nstemi in 12/22.  HISTORY OF PRESENT ILLNESS:  Emily Chaney is a 88 y.o. year old female  with EF 35%, increased LE edema, bil 4+, debility, immobility, fall risk. Has had recent covid infection and now in rehab to strengthen with goal of going home. Alert and  oriented x 3, her baseline. States appetite is fair. Wt confounded by fluid; dry weight 108 lbs, now 130 lbs. History obtained from review of EMR, discussion with primary team, and interview with family, facility staff/caregiver and/or Ms. Emily Chaney.  I reviewed available labs, medications, imaging, studies and related documents from the EMR.  Records reviewed and summarized above.   ROS   General: NAD EYES: denies vision changes ENMT: denies dysphagia Cardiovascular: denies chest pain, endorse mild DOE Pulmonary: denies cough, denies increased SOB Abdomen: endorses fair  appetite, endorses occ constipation, endorses continence of bowel GU: denies dysuria, endorses continence of urine MSK:  endorses increased weakness,  no falls reported Skin: denies rashes or wounds Neurological: denies pain, denies insomnia Psych: Endorses positive mood Heme/lymph/immuno: denies bruises, abnormal bleeding  Physical Exam: Current and past weights: 130 lbs, 22 lbs over dry weight Constitutional: NAD, 130/60 HR  HR 76  RR 18 General: frail appearing, thin EYES: anicteric sclera, lids intact, no discharge  ENMT: hard of hearing, oral mucous membranes moist, dentition intact CV: S1S2, RRR, 4+ bil LE edema to thighs Pulmonary: LCTA, no increased work of breathing, no cough, room air Abdomen: intake 50%, normo-active BS + 4 quadrants, soft and non tender, no ascites GU: deferred MSK: + sarcopenia, moves all extremities, ambulatory with walker and stand by Skin: warm and dry, no rashes or wounds on visible skin Neuro:  + generalized weakness,  no cognitive impairment Psych: non-anxious affect, A and O x 3 Hem/lymph/immuno: no widespread bruising CURRENT PROBLEM LIST:  Patient Active Problem List   Diagnosis Date Noted   Hyponatremia 05/11/2020   Elevated TSH 04/30/2020   Anemia 02/06/2020   Fatigue 02/06/2020   Lymphoproliferative disorder (HCC) 03/08/2019   Chronic lymphocytic leukemia ([facility]) 02/28/2019   PAST MEDICAL HISTORY:  Active Ambulatory Problems    Diagnosis Date Noted   Chronic lymphocytic leukemia ([facility]) 02/28/2019   Lymphoproliferative disorder ([facility]) 03/08/2019   Anemia 02/06/2020   Fatigue 02/06/2020   Elevated TSH 04/30/2020   Hyponatremia 05/11/2020   Resolved Ambulatory Problems    Diagnosis Date Noted   No Resolved Ambulatory Problems   Past Medical History:  Diagnosis Date   Coronary artery disease    Hypertension    Osteoporosis    Sciatica    Shoulder injury, right, initial encounter    SOCIAL HX:  Social History   Tobacco Use    Smoking status: Never   Smokeless tobacco: Never  Substance Use Topics   Alcohol use: Never   FAMILY HX:  Family History  Problem Relation Age of Onset   Cancer Mother       ALLERGIES:  Allergies  Allergen Reactions   Amlodipine Rash   Alendronate Other (See Comments)    Swallowing difficulty Swallowing difficulty    Hydralazine Other (See Comments)    Other reaction(s): Dizziness lightheaded lightheaded    Sulfa Antibiotics Other (See Comments)    Other Reaction: OTHER REACTION: NUMBNESS      PERTINENT MEDICATIONS:  Outpatient Encounter Medications as of 09/23/2021  Medication Sig   Multiple Vitamins-Minerals (MULTIVITAMIN ADULT PO) Take 1 tablet by mouth daily.    acetaminophen (TYLENOL) 325 MG tablet Take 325 mg by mouth every 6 (six) hours as needed.    aspirin EC 81 MG tablet Take 81 mg by mouth daily.    atorvastatin (LIPITOR) 40 MG tablet Take 40 mg by mouth daily at 6 PM.    atorvastatin (LIPITOR) 40 MG tablet Take by mouth.   busPIRone (  BUSPAR) 5 MG tablet Take 5 mg by mouth daily.    Calcium 200 MG TABS Take 400 mg by mouth 2 (two) times a day.   carvedilol (COREG) 12.5 MG tablet Take by mouth 2 (two) times daily with a meal. 2 Tablets AM, 1 Tablet QHS   chlorthalidone (HYGROTON) 25 MG tablet Take by mouth. 1 whole tab in the morning( 25 mg) and 1/2 tab in the afternoon ( 12.5)   lisinopril (ZESTRIL) 20 MG tablet Take 20 mg by mouth 2 (two) times a day.    No facility-administered encounter medications on file as of 09/23/2021.   Thank you for the opportunity to participate in the care of Ms. Mervine.  The palliative care team will continue to follow. Please call our office at [phone] if we can be of additional assistance.   Jason Coop, NP , DNP, AGPCNP-BC  COVID-19 PATIENT SCREENING TOOL Asked and negative response unless otherwise noted:  Have you had symptoms of covid, tested positive or been in contact with someone with symptoms/positive  test in the past 5-10 days?

## 2021-09-29 ENCOUNTER — Telehealth: Payer: Self-pay

## 2021-09-29 NOTE — Telephone Encounter (Signed)
334 pm.  Return call made to Liborio Nixon regarding a telehealth visit with Ralene Bathe, NP.  Visit scheduled for 10/05/21 @ 1 pm.

## 2021-10-05 ENCOUNTER — Other Ambulatory Visit: Payer: Medicare Other | Admitting: Primary Care

## 2021-10-05 ENCOUNTER — Other Ambulatory Visit: Payer: Self-pay

## 2021-10-05 DIAGNOSIS — U071 COVID-19: Secondary | ICD-10-CM

## 2021-10-05 DIAGNOSIS — Z515 Encounter for palliative care: Secondary | ICD-10-CM

## 2021-10-05 NOTE — Progress Notes (Addendum)
° ° °  [facility] Pall[facility] Consult Note Telephone: [phone]  Fax: [phone]   Due to the COVID-19 crisis, this visit was done via telemedicine from my office and it was initiated and consent by this patient and or family.  I connected with  Emily Chaney OR PROXY on 10/05/21 by a video enabled telemedicine application and verified that I am speaking with the correct person using two identifiers.   I discussed the limitations of evaluation and management by telemedicine. The patient expressed understanding and agreed to proceed.  Date of encounter: 10/05/21 PATIENT NAME: Emily Chaney [address] [place] [address]   [phone] (home)  DOB: 02-05-1925 MRN: [id number] PRIMARY CARE PROVIDER:    Barbaraann Boys, MD,  [place] [address] [phone]  REFERRING PROVIDER:   Barbaraann Chaney, [place],  [place] [address] [phone]  RESPONSIBLE PARTY:    Contact Information     Name Relation Home Work Mobile   Emily Chaney Daughter [phone]         I met face to face with family per telemedicine. Palliative Care was asked to follow this patient by consultation request of  Emily Boys, MD to address advance care planning.    ASSESSMENT AND PLAN / RECOMMENDATIONS:   Advance Care Planning/Goals of Care: Goals include to maximize quality of life and symptom management. Patient/health care surrogate gave his/her permission to discuss.Our advance care planning conversation included a discussion about:    The value and importance of advance care planning  Experiences with loved ones who have been seriously ill or have died  Exploration of personal, cultural or spiritual beliefs that might influence medical decisions. Exploration of goals of care in the event of a sudden injury or illness  Identification and preparation of a healthcare agent - son and daughter Review of an  advance directive  document Decision discussed to de-escalate disease focused treatments due to poor prognosis. CODE STATUS: DNR Discussed patient's decline and her statement she is "getting tired". Discussed hospice services and how that would work in SNF or at home. Outlined the services of hospice Family needs to get in extra care and discuss with SNF for d/c if she comes home.  Follow up Palliative Care Visit: Palliative care will continue to follow for complex medical decision making, advance care planning, and clarification of goals. Return 1 weeks or prn. Daughter to call   I spent 40 minutes providing this consultation. More than 50% of the time in this consultation was spent in counseling and care coordination.  Thank you for the opportunity to participate in the care of Emily Chaney.  The palliative care team will continue to follow. Please call our office at [phone] if we can be of additional assistance.   Emily Coop, NP ,  [facility]

## 2021-10-09 ENCOUNTER — Telehealth: Payer: Self-pay

## 2021-10-09 ENCOUNTER — Non-Acute Institutional Stay: Payer: Medicare Other | Admitting: Primary Care

## 2021-10-09 ENCOUNTER — Telehealth: Payer: Self-pay | Admitting: Primary Care

## 2021-10-09 ENCOUNTER — Other Ambulatory Visit: Payer: Self-pay

## 2021-10-09 DIAGNOSIS — R5381 Other malaise: Secondary | ICD-10-CM

## 2021-10-09 DIAGNOSIS — I214 Non-ST elevation (NSTEMI) myocardial infarction: Secondary | ICD-10-CM

## 2021-10-09 DIAGNOSIS — R5383 Other fatigue: Secondary | ICD-10-CM

## 2021-10-09 DIAGNOSIS — I509 Heart failure, unspecified: Secondary | ICD-10-CM

## 2021-10-09 DIAGNOSIS — Z515 Encounter for palliative care: Secondary | ICD-10-CM

## 2021-10-09 DIAGNOSIS — U071 COVID-19: Secondary | ICD-10-CM

## 2021-10-09 NOTE — Telephone Encounter (Signed)
T/c to daughter. Daughter spoke with pt RE hospice /home health and her disposition from SNF. She felt coming home with hospice would be her best option.Daughter states she became relieved and more animated with decision. Discussed her potential for recovery. Patient would like to come home on hospice. Would like meeting with [phone] for Cristal Ford,, pt son to discuss further.

## 2021-10-09 NOTE — Telephone Encounter (Signed)
1220 pm. Incoming call from [facility] with Dr. Burna Sis office.  PCP okay with hospice referral and will serve as AOR.  PCP has requested hospice standing orders be sent to her so she can check of items she is agreeable too.  PCP does not follow U/A results.  Notified Ralene Bathe, NP of call and new orders.

## 2021-10-09 NOTE — Progress Notes (Signed)
° ° °  [facility] Palliative Care Consult Note Telephone: [phone]  Fax: [phone]    Due to the COVID-19 crisis, this visit was done via telemedicine from my office and it was initiated and consent by this patient and or family. I connected with  Amzie Sillas OR PROXY on 10/09/21 by a telemedicine application and verified that I am speaking with the correct person using two identifiers.  I discussed the limitations of evaluation and management by telemedicine. The patient /proxy expressed understanding and agreed to proceed.  Date of encounter: 10/09/21 PATIENT NAME: Emily Chaney [address] [place] [address]   [phone] (home)  DOB: [birth year] MRN: [id number] PRIMARY CARE PROVIDER:    Barbaraann Boys, MD,  [place] [address] [phone]  REFERRING PROVIDER:   Barbaraann Boys, [place] [facility] [place],   [address] [phone]  RESPONSIBLE PARTY:   Extended Emergency Contact Information Primary Emergency Contact: Glenice Laine [place] Phone: [phone] Relation: Daughter Secondary Emergency Contact: [place] Phone: [phone] Relation: Son    Palliative Care was asked to follow this patient by consultation request of  Barbaraann Boys, MD  and [facility] to address advance care planning.                                   ASSESSMENT AND PLAN / RECOMMENDATIONS:   Advance Care Planning/Goals of Care: Goals include to maximize quality of life and symptom management. Patient/health care surrogate gave his/her permission to discuss.Our advance care planning conversation included a discussion about:      Exploration of personal, cultural or spiritual beliefs that might influence medical decisions  Exploration of goals of care in the event of a sudden injury or illness  Discussion RE to de-escalate disease focused treatments due to poor prognosis. CODE STATUS: DNR Discussed hospice  services and choices with Blanch Media, daughter. Answered questions about medical management of edema and SOB sx, decreased mobility and fatigue. Discussed hospice again with son, and reviewed note from [facility] of 10/07/21. Patient was seen in cardiology office 1/25 and had chlorthalidone changed to lasix 20 mg. Pt is continuing fluid gain. Reviewed discussion with sister Blanch Media RE patient's desire to come home for supportive treatments. EF has returned to baseline of 55% but her edema and DOE continues. I have reached out to Jettie Booze PA to discuss  their plan but have not gotten in touch.Family would like to continue with hospice plan, per patient. They reiterated this was her wish with her and are supportive. If possible, could cardiology recommend improved edema management modality? Explained hospice referral process.   Follow up Palliative Care Visit: f/u if not admitted to hospice.   I spent 50 minutes providing this consultation. More than 50% of the time in this consultation was spent in counseling and care coordination.  Thank you for the opportunity to participate in the care of Ms. Llorens.  The palliative care team will continue to follow. Please call our office at [phone] if we can be of additional assistance.   Jason Coop, NP ,   COVID-19 PATIENT SCREENING TOOL Asked and negative response unless otherwise noted:   Have you had symptoms of covid, tested positive or been in contact with someone with symptoms/positive test in the past 5-10 days?

## 2021-10-20 ENCOUNTER — Other Ambulatory Visit: Payer: Self-pay | Admitting: *Deleted

## 2021-10-20 DIAGNOSIS — D649 Anemia, unspecified: Secondary | ICD-10-CM

## 2021-10-20 DIAGNOSIS — C9110 Chronic lymphocytic leukemia of B-cell type not having achieved remission: Secondary | ICD-10-CM

## 2021-10-22 ENCOUNTER — Telehealth: Payer: Self-pay | Admitting: *Deleted

## 2021-10-22 NOTE — Telephone Encounter (Signed)
Daughter called to cancel upcoming appointments. Patient is now with Hospice care. She would like to be notified that the appointment has been canceeled.

## 2021-10-26 ENCOUNTER — Inpatient Hospital Stay: Payer: Medicare Other

## 2021-10-26 ENCOUNTER — Inpatient Hospital Stay: Payer: Medicare Other | Admitting: Oncology

## 2021-10-28 ENCOUNTER — Ambulatory Visit: Payer: Medicare Other | Admitting: Oncology

## 2021-10-28 ENCOUNTER — Other Ambulatory Visit: Payer: Medicare Other
